# Patient Record
Sex: Female | Born: 1985 | Race: White | Hispanic: Yes | Marital: Married
Health system: Midwestern US, Academic
[De-identification: ages and names within clinical notes are randomized; demographics above are authoritative.]

---

## 2017-07-30 ENCOUNTER — Encounter: Admit: 2017-07-30 | Discharge: 2017-07-30 | Payer: BC Managed Care – PPO

## 2017-07-30 ENCOUNTER — Ambulatory Visit: Admit: 2017-07-30 | Discharge: 2017-07-30 | Payer: BC Managed Care – PPO

## 2017-07-30 ENCOUNTER — Ambulatory Visit: Admit: 2017-07-30 | Discharge: 2017-07-31 | Payer: BC Managed Care – PPO

## 2017-07-30 DIAGNOSIS — F329 Major depressive disorder, single episode, unspecified: Principal | ICD-10-CM

## 2017-07-30 DIAGNOSIS — S6991XS Unspecified injury of right wrist, hand and finger(s), sequela: Principal | ICD-10-CM

## 2017-07-30 DIAGNOSIS — Z0000 Encounter for general adult medical examination without abnormal findings: Principal | ICD-10-CM

## 2017-07-30 LAB — LIPID PROFILE
Lab: 115 mg/dL (ref 6.0–8.0)
Lab: 166 mg/dL (ref <200)
Lab: 17 mg/dL (ref 8.5–10.6)
Lab: 87 mg/dL (ref <150)
Lab: 98 mg/dL (ref <100)

## 2017-07-30 LAB — COMPREHENSIVE METABOLIC PANEL
Lab: 0.5 mg/dL (ref 0.3–1.2)
Lab: 13 U/L (ref 7–40)
Lab: 137 MMOL/L (ref 137–147)
Lab: 26 MMOL/L (ref 21–30)
Lab: 4.5 MMOL/L — ABNORMAL LOW (ref 3.5–5.1)
Lab: 4.6 g/dL (ref 3.5–5.0)
Lab: 49 U/L (ref 25–110)

## 2017-07-30 LAB — HIV 1& 2 AG-AB SCRN W REFLEX HIV 1 PCR QUANT: Lab: NEGATIVE % — ABNORMAL HIGH (ref 11–15)

## 2017-07-30 LAB — HEMOGLOBIN A1C: Lab: 5.4 % (ref 4.0–6.0)
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
## 2017-07-30 NOTE — Progress Notes
Patient presents to clinic for Tdap & Influenza injections. Injections given without difficulty, VIS sheets given. Consent form signed.

## 2017-08-02 ENCOUNTER — Ambulatory Visit: Admit: 2017-07-30 | Discharge: 2017-07-30 | Payer: BC Managed Care – PPO

## 2017-08-02 DIAGNOSIS — S6991XS Unspecified injury of right wrist, hand and finger(s), sequela: Principal | ICD-10-CM

## 2017-08-04 ENCOUNTER — Encounter: Admit: 2017-08-04 | Discharge: 2017-08-04 | Payer: BC Managed Care – PPO

## 2017-08-04 ENCOUNTER — Ambulatory Visit: Admit: 2017-08-04 | Discharge: 2017-08-04 | Payer: BC Managed Care – PPO

## 2017-08-04 DIAGNOSIS — F329 Major depressive disorder, single episode, unspecified: Principal | ICD-10-CM

## 2017-08-04 DIAGNOSIS — S62344S Nondisplaced fracture of base of fourth metacarpal bone, right hand, sequela: Principal | ICD-10-CM
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
## 2017-09-21 ENCOUNTER — Ambulatory Visit: Admit: 2017-09-21 | Discharge: 2017-09-21 | Payer: BC Managed Care – PPO

## 2017-09-21 ENCOUNTER — Encounter: Admit: 2017-09-21 | Discharge: 2017-09-21 | Payer: BC Managed Care – PPO

## 2017-09-21 DIAGNOSIS — M25572 Pain in left ankle and joints of left foot: Principal | ICD-10-CM

## 2017-09-21 DIAGNOSIS — F329 Major depressive disorder, single episode, unspecified: Principal | ICD-10-CM

## 2017-10-27 ENCOUNTER — Encounter: Admit: 2017-10-27 | Discharge: 2017-10-27 | Payer: BC Managed Care – PPO

## 2017-10-27 ENCOUNTER — Ambulatory Visit: Admit: 2017-10-27 | Discharge: 2017-10-27 | Payer: BC Managed Care – PPO

## 2017-10-27 DIAGNOSIS — F329 Major depressive disorder, single episode, unspecified: Principal | ICD-10-CM

## 2017-10-27 DIAGNOSIS — S93402D Sprain of unspecified ligament of left ankle, subsequent encounter: Principal | ICD-10-CM

## 2018-06-06 ENCOUNTER — Encounter: Admit: 2018-06-06 | Discharge: 2018-06-06 | Payer: BC Managed Care – PPO

## 2018-08-24 ENCOUNTER — Encounter: Admit: 2018-08-24 | Discharge: 2018-08-24 | Payer: BC Managed Care – PPO

## 2018-08-24 ENCOUNTER — Ambulatory Visit: Admit: 2018-08-24 | Discharge: 2018-08-24 | Payer: BC Managed Care – PPO

## 2018-08-24 DIAGNOSIS — Z0000 Encounter for general adult medical examination without abnormal findings: Secondary | ICD-10-CM

## 2018-08-24 DIAGNOSIS — Z1159 Encounter for screening for other viral diseases: Secondary | ICD-10-CM

## 2018-08-24 DIAGNOSIS — N979 Female infertility, unspecified: Secondary | ICD-10-CM

## 2018-08-24 DIAGNOSIS — F329 Major depressive disorder, single episode, unspecified: Secondary | ICD-10-CM

## 2018-08-24 LAB — HEPATITIS C ANTIBODY W REFLEX HCV PCR QUANT: Lab: NEGATIVE

## 2018-08-24 LAB — 25-OH VITAMIN D (D2 + D3): Lab: 11 ng/mL — ABNORMAL LOW (ref 30–80)

## 2018-09-08 ENCOUNTER — Encounter: Admit: 2018-09-08 | Discharge: 2018-09-09 | Payer: BC Managed Care – PPO

## 2018-09-08 DIAGNOSIS — R69 Illness, unspecified: Secondary | ICD-10-CM

## 2018-10-10 ENCOUNTER — Encounter: Admit: 2018-10-10 | Discharge: 2018-10-10 | Payer: BC Managed Care – PPO

## 2018-10-26 ENCOUNTER — Encounter: Admit: 2018-10-26 | Discharge: 2018-10-26 | Payer: BC Managed Care – PPO

## 2018-10-31 ENCOUNTER — Encounter: Admit: 2018-10-31 | Discharge: 2018-10-31 | Payer: BC Managed Care – PPO

## 2018-10-31 ENCOUNTER — Ambulatory Visit: Admit: 2018-10-31 | Discharge: 2018-11-01 | Payer: BC Managed Care – PPO

## 2018-10-31 DIAGNOSIS — F329 Major depressive disorder, single episode, unspecified: Principal | ICD-10-CM

## 2018-11-01 ENCOUNTER — Encounter: Admit: 2018-11-01 | Discharge: 2018-11-01 | Payer: BC Managed Care – PPO

## 2018-11-01 ENCOUNTER — Ambulatory Visit: Admit: 2018-11-01 | Discharge: 2018-11-01 | Payer: BC Managed Care – PPO

## 2018-11-01 DIAGNOSIS — R69 Illness, unspecified: Principal | ICD-10-CM

## 2018-11-01 DIAGNOSIS — F329 Major depressive disorder, single episode, unspecified: Principal | ICD-10-CM

## 2018-11-01 DIAGNOSIS — G8929 Other chronic pain: ICD-10-CM

## 2018-11-01 DIAGNOSIS — M25511 Pain in right shoulder: Principal | ICD-10-CM

## 2018-11-01 DIAGNOSIS — S43431D Superior glenoid labrum lesion of right shoulder, subsequent encounter: Principal | ICD-10-CM

## 2018-11-01 NOTE — Progress Notes
Substance and Sexual Activity   Alcohol Use Yes       Family History   Problem Relation Age of Onset   ??? Diabetes Mother    ??? High Cholesterol Mother    ??? Hypertension Mother    ??? Miscarriages Mother    ??? Diabetes Sister    ??? Cancer Maternal Grandmother    ??? High Cholesterol Maternal Grandmother          General Physical Exam:  General/Constitutional:No apparent distress: well-nourished and well developed.  Eyes: Sclera nonicteric, conjunctiva clear  Respiratory:No shortness of breath or dyspnea  Cardiac: No clubbing, cyanosis, or edema  Vascular: No edema, swelling or tenderness, except as noted in detailed exam.  Integumentary:No impressive skin lesions present, except as noted in detailed exam.  Neuro/Psych: Normal mood and affect, oriented to person, place and time.  Musculoskeletal: Normal, except as noted in detailed exam and in HPI.      Review Of Systems:  A 14 point review of systems including HEENT, cardiovascular, pulmonary, gastrointestinal,?genitourinary, psychiatric, neurologic, musculoskeletal, endocrine, and integumentary are negative unless otherwise noted in the history of present illness or on the signed intake form.        Objective:           Vitals:    11/01/18 1427   BP: 112/72   BP Source: Arm, Left Upper   Patient Position: Sitting   Pulse: 75   Weight: 97.5 kg (215 lb)   Height: 175.3 cm (69)   PainSc: Three     Body mass index is 31.75 kg/m???.     ATTESTATION  I personally performed the E/M including history, physical exam, and MDM.    Staff name:  Abran Duke, MD Date:  11/01/2018          Abran Duke, MD    Portions of this noted may have been created using Dragon, a voice recognition software.  Please contact my office for any clarification of documentation    Please send a copy of office notes to the primary care physician and referring providers

## 2019-02-08 ENCOUNTER — Encounter: Admit: 2019-02-08 | Discharge: 2019-02-08

## 2019-02-08 NOTE — Telephone Encounter
Phone call to [facility], pt requests sperm to be sent to our location. Darnelle Spangle will send an email to set up an account c [employer] to have sperm sent to [place]. Email received and forwarded to Royetta Crochet., RN to complete and return.     Please put pt's name and DOB in the subject line. Please use [address] [place], [place]  Routing to  Royetta Crochet., RN for action.

## 2019-02-08 NOTE — Telephone Encounter
Karen Barr with Karen Barr requesting a call back at [phone] in order to ship donor sperm. LVM [id number]

## 2019-02-10 ENCOUNTER — Encounter: Admit: 2019-02-10 | Discharge: 2019-02-10

## 2019-02-10 NOTE — Telephone Encounter
Phone call to pt, pt reports semen going to be directly delivered to pt home, pt just needs a dr name for pw to prove dr is working with pt. This RN will email pw this day.           PW emailed to [email or web address]

## 2019-02-10 NOTE — Telephone Encounter
Pt. returning call. LVM [id number]

## 2019-02-10 NOTE — Telephone Encounter
Phone call to pt, LVM to call this RN back regarding email. Number provided.

## 2019-02-10 NOTE — Telephone Encounter
Routing to Dr. Yedlinsky.

## 2019-02-10 NOTE — Telephone Encounter
Please clarify with the patient that she just needs a doctor's office address to send the sperm to and that she understands we do not do the insemination in our clinic.  Otherwise, ok to have the sperm sent to our office.  Dr. Sanda Klein

## 2019-02-22 ENCOUNTER — Encounter: Admit: 2019-02-22 | Discharge: 2019-02-22

## 2019-03-01 ENCOUNTER — Ambulatory Visit: Admit: 2019-03-01 | Discharge: 2019-03-01

## 2019-03-01 ENCOUNTER — Encounter: Admit: 2019-03-01 | Discharge: 2019-03-01

## 2019-03-01 DIAGNOSIS — H663X9 Other chronic suppurative otitis media, unspecified ear: Secondary | ICD-10-CM

## 2019-03-01 DIAGNOSIS — F329 Major depressive disorder, single episode, unspecified: Secondary | ICD-10-CM

## 2019-03-01 MED ORDER — AMOXICILLIN-POT CLAVULANATE 875-125 MG PO TAB
1 | ORAL_TABLET | Freq: Two times a day (BID) | ORAL | 0 refills | 7.00000 days | Status: DC
Start: 2019-03-01 — End: 2019-06-06

## 2019-03-01 NOTE — Patient Instructions
Please take antibiotics as directed. Follow with ENT for chronic perforation and drainage.

## 2019-03-01 NOTE — Progress Notes
Date of Service: 03/01/2019    Karen Barr is a 33 y.o. female.  DOB: September 27, 1985  MRN: [id number]     Subjective:             History of Present Illness    Here today with complaint of ear pain and drainage. She reports that she has been having ear pain and drainage for the last several weeks but really has it all the time off and on. She was seen elsewhere and diagnosed with a perforation but reports she had tubes about 10 years ago and the hole from the tubes never healed. She has no other issues or concerns.      Review of Systems   All other systems reviewed and are negative.        Objective:         ??? amoxicillin-potassium clavulanate (AUGMENTIN) 875/125 mg tablet Take one tablet by mouth every 12 hours. Take with food.   ??? IBUPROFEN PO Take  by mouth.     Vitals:    03/01/19 1321   BP: 126/66   BP Source: Arm, Left Upper   Patient Position: Sitting   Pulse: 84   Resp: 16   Temp: 36.6 ???C (97.8 ???F)   TempSrc: Oral   SpO2: 98%   Weight: 99.8 kg (220 lb)   Height: 175.3 cm (69)   PainSc: Three     Body mass index is 32.49 kg/m???.     Physical Exam  Vitals signs reviewed.   Constitutional:       General: She is not in acute distress.     Appearance: She is well-developed.   HENT:      Head: Normocephalic.      Right Ear: External ear normal. Tympanic membrane is scarred and perforated.      Left Ear: External ear normal. Tympanic membrane is scarred and perforated.      Nose: Nose normal.      Mouth/Throat:      Pharynx: No oropharyngeal exudate.   Neck:      Musculoskeletal: Normal range of motion.   Cardiovascular:      Rate and Rhythm: Normal rate and regular rhythm.      Heart sounds: Normal heart sounds.   Pulmonary:      Effort: Pulmonary effort is normal.      Breath sounds: Normal breath sounds.   Skin:     General: Skin is warm.      Capillary Refill: Capillary refill takes less than 2 seconds.   Neurological:      Mental Status: She is alert.              Assessment and Plan: 1. Other chronic suppurative otitis media, unspecified laterality       Patient Instructions   Please take antibiotics as directed. Follow with ENT for chronic perforation and drainage.

## 2019-04-27 ENCOUNTER — Encounter: Admit: 2019-04-27 | Discharge: 2019-04-27

## 2019-05-17 ENCOUNTER — Encounter: Admit: 2019-05-17 | Discharge: 2019-05-17 | Payer: BC Managed Care – PPO

## 2019-06-06 ENCOUNTER — Encounter: Admit: 2019-06-06 | Discharge: 2019-06-06 | Payer: BC Managed Care – PPO

## 2019-06-06 ENCOUNTER — Ambulatory Visit: Admit: 2019-06-06 | Discharge: 2019-06-07 | Payer: BC Managed Care – PPO

## 2019-06-06 DIAGNOSIS — Z319 Encounter for procreative management, unspecified: Secondary | ICD-10-CM

## 2019-06-06 DIAGNOSIS — F329 Major depressive disorder, single episode, unspecified: Secondary | ICD-10-CM

## 2019-06-06 MED ORDER — CLOMIPHENE CITRATE 50 MG PO TAB
50 mg | ORAL_TABLET | Freq: Every day | ORAL | 3 refills | 5.00000 days | Status: DC
Start: 2019-06-06 — End: 2019-09-18

## 2019-06-06 MED ORDER — PRENATAL 19 29 MG IRON- 1 MG PO CHEW
1 | ORAL_TABLET | Freq: Every day | ORAL | 3 refills | 90.00000 days | Status: DC
Start: 2019-06-06 — End: 2019-09-18

## 2019-06-06 NOTE — Patient Instructions
Day 1 is the first day of your menstrual cycle  Start taking clomid 168m (2 tablets) once per day starting Day 3. Take for 5 days  Start checking ovulation kit in the morning around day 10.  IUI is day after ovulation kit is positive ([facility])  Home pregnancy test on day 28 if no period

## 2019-06-06 NOTE — Progress Notes
Obtained patient's verbal consent to treat them and their agreement to [facility] financial policy and NPP via this telehealth visit during the [facility] Emergency  Video telehealth appt completed.   Start time: 11:18      Subjective:       History of Present Illness  Karen Barr is a 33 y.o. female.     Hx of:   Medical History:   Diagnosis Date   ? Depression        Patient is on Zoom video-conferencing telemedicine appt today for:  Chief Complaint   Patient presents with   ? Follow Up     fertility       Fertility f/u   Recommended to pt that she try fertility medications (clomid) w/ insemination. Pt states that she has been tracking her menstrual cycles for 18 months. 25-27 days between periods. Denies using LH ovulation test kits at this time. Denies family hx of fertility issues.     Review of Systems   Constitutional: Negative for chills, fatigue and fever.   HENT: Negative.    Eyes: Negative.    Respiratory: Negative for cough and shortness of breath.    Cardiovascular: Negative for chest pain.   Gastrointestinal: Negative for abdominal pain, blood in stool, constipation, diarrhea, nausea and vomiting.   Endocrine: Negative.    Genitourinary: Negative for difficulty urinating, dysuria, frequency, hematuria and urgency.   Musculoskeletal: Negative.    Skin: Negative.    Allergic/Immunologic: Negative.    Neurological: Negative for dizziness, light-headedness and headaches.   Psychiatric/Behavioral: Negative for dysphoric mood and sleep disturbance. The patient is not nervous/anxious.          Objective:         ? clomiPHENE (CLOMID) 50 mg tablet Take one tablet by mouth daily.   ? IBUPROFEN PO Take  by mouth.   ? prenatal no115-iron-folic acid (PRENATAL 19) 29 mg iron- 1 mg chew Chew 1 tablet by mouth daily.       Patient reported vitals signs if applicable:  Vitals:    06/06/19 1112   Weight: 95.3 kg (210 lb)   Height: 175.3 cm (69) Physical exam limited, due to this being a video telemedicine appt.   Physical Exam  Vitals signs and nursing note reviewed.   Constitutional:       General: She is not in acute distress.     Appearance: She is not diaphoretic.   HENT:      Head: Normocephalic and atraumatic.   Neck:      Musculoskeletal: Neck supple.   Pulmonary:      Effort: Pulmonary effort is normal. No respiratory distress.   Neurological:      Mental Status: She is alert.   Psychiatric:         Mood and Affect: Mood normal.         Behavior: Behavior normal.         Thought Content: Thought content normal.         Judgment: Judgment normal.          Assessment and Plan:    Problem List Items Addressed This Visit     None      Visit Diagnoses     Infertility management    -  Primary    Relevant Medications    clomiPHENE (CLOMID) 50 mg tablet    prenatal no115-iron-folic acid (PRENATAL 19) 29 mg iron- 1 mg chew  Visit Start Time 11:18   Visit End Time 11:32.    Total time 14 minutes.    Estimated counseling time 14 minutes.    Counseled pt regarding the symptoms, possible etiologies, treatment options and management decisions.      Patient Instructions   Day 1 is the first day of your menstrual cycle  Start taking clomid 100mg  (2 tablets) once per day starting Day 3. Take for 5 days  Start checking ovulation kit in the morning around day 10.  IUI is day after ovulation kit is positive (LH surge)  Home pregnancy test on day 28 if no period        No future appointments.  In the presence of [name], MD,  I have taken down these notes, Karen Barr, Scribe. 06/06/2019 11:43 AM    [name], MD  June 06, 2019     Voice recognition software was used for this dictation.  Every attempt was made to edit but small errrors may persist.  Please call me directly if any concerns arise.    ATTESTATION    I personally performed the history and physical evaluation and assessment and plan. I have reviewed the transcription and agree with the documentation and have made changes where appropriate.    [name], MD     Clomiphene days 3-7 with home IUI

## 2019-06-07 ENCOUNTER — Encounter: Admit: 2019-06-07 | Discharge: 2019-06-07 | Payer: BC Managed Care – PPO

## 2019-06-07 NOTE — Telephone Encounter
Phone call to pt, states for a specimen to be sent to her house from [facility], they need Dr. Delight Ovens approval. Pt states the fax was sent yesterday. Advised this RN will check for the fax and will call back.

## 2019-06-07 NOTE — Telephone Encounter
TC to patient to schedule with Dr. Sanda Klein. While on the phone the patient stated "Do you know if you guys received our fax yet? It's in regards to at home fertility kit we have."    RT Beth, LPN for review.  Scherry Ran, Therapist, sports for [employer].

## 2019-06-14 ENCOUNTER — Encounter: Admit: 2019-06-14 | Discharge: 2019-06-14 | Payer: BC Managed Care – PPO

## 2019-07-18 ENCOUNTER — Encounter: Admit: 2019-07-18 | Discharge: 2019-07-18 | Payer: BC Managed Care – PPO

## 2019-07-18 ENCOUNTER — Ambulatory Visit: Admit: 2019-07-18 | Discharge: 2019-07-19 | Payer: BC Managed Care – PPO

## 2019-07-18 DIAGNOSIS — N979 Female infertility, unspecified: Secondary | ICD-10-CM

## 2019-07-18 DIAGNOSIS — U071 COVID-19: Secondary | ICD-10-CM

## 2019-07-18 DIAGNOSIS — F329 Major depressive disorder, single episode, unspecified: Secondary | ICD-10-CM

## 2019-07-18 NOTE — Progress Notes
Telehealth Visit Note    Date of Service: 07/18/2019    Subjective:      Obtained patient's verbal consent to treat them and their agreement to [facility] financial policy and NPP via this telehealth visit during the [facility] Emergency       Karen Barr is a 33 y.o. female.    Fever   This is a new problem. The current episode started in the past 7 days. The problem occurs 2 to 4 times per day. The problem has been waxing and waning. The maximum temperature noted was 101 to 101.9 F. The temperature was taken using an oral thermometer. Associated symptoms include congestion, coughing, headaches, muscle aches, nausea, sleepiness and a sore throat.     She and her wife did 1 cycle of ovulation induction with clomid and home IUI which was unsuccessful, Patient is ovulating now. She is holding off this month due to presumed COVID. Got sick 07/16/2019, test result pending       Review of Systems   Constitutional: Positive for fever.   HENT: Positive for congestion and sore throat.    Respiratory: Positive for cough.    Gastrointestinal: Positive for nausea.   Neurological: Positive for headaches.         Objective:         ? acetaminophen (TYLENOL PO) Take  by mouth.   ? clomiPHENE (CLOMID) 50 mg tablet Take one tablet by mouth daily.   ? guaifenesin/dextromethorphan (MUCINEX DM PO) Take  by mouth.   ? IBUPROFEN PO Take  by mouth.   ? prenatal no115-iron-folic acid (PRENATAL 19) 29 mg iron- 1 mg chew Chew 1 tablet by mouth daily.     There were no vitals filed for this visit.  There is no height or weight on file to calculate BMI.     Telehealth Patient Reported Vitals     Row Name 07/18/19 1318                Pulse:  88        Temp:  (!) 38.6 ?C (101.4 ?F)        Temp Source:  Oral        Weight:  95.3 kg (210 lb)        Height:  175.3 cm (69)              Physical Exam  Constitutional: appears well-developed and well-nourished in no acute distress  Head: Normocephalic and atraumatic. Eyes: Conjunctivae and EOM are normal.  Pulmonary/Chest: Effort normal. No respiratory distress.  +cough  Neurological:  alert and oriented to person, place, and time.   Skin: normal color on exposed area.   Psychiatric:  Normal mood and affect. Behavior is normal. Judgment and thought content normal.   Nursing note reviewed.         Assessment and Plan:  Karen Barr was seen today for infertility and covid-19 exposure.    Diagnoses and all orders for this visit:    COVID-19    Infertility, female      Discussed management of COVID symptoms. Patient educated re: expected course and duration.  Follow up after next cycle  There are no Patient Instructions on file for this visit.                           12 minutes spent on this patient's encounter with counseling and coordination of care taking >  50% of the visit.

## 2019-07-19 ENCOUNTER — Encounter: Admit: 2019-07-19 | Discharge: 2019-07-19 | Payer: BC Managed Care – PPO

## 2019-09-18 ENCOUNTER — Encounter: Admit: 2019-09-18 | Discharge: 2019-09-18 | Payer: BC Managed Care – PPO

## 2019-09-18 ENCOUNTER — Ambulatory Visit: Admit: 2019-09-18 | Discharge: 2019-09-18 | Payer: BC Managed Care – PPO

## 2019-09-18 DIAGNOSIS — F329 Major depressive disorder, single episode, unspecified: Secondary | ICD-10-CM

## 2019-09-18 DIAGNOSIS — K2970 Gastritis, unspecified, without bleeding: Secondary | ICD-10-CM

## 2019-09-18 MED ORDER — OMEPRAZOLE 20 MG PO CPDR
20 mg | ORAL_CAPSULE | Freq: Every day | ORAL | 0 refills | Status: DC
Start: 2019-09-18 — End: 2020-02-09

## 2019-09-18 NOTE — Progress Notes
Date of Service: 09/18/2019    Subjective:             Karen Barr is a 34 y.o. female.    History of Present Illness    Abdominal Pain  Started 9 years ago  Right below sternum, would build in intensity  Felt like stabbing,   Went to ER before for severe pain  Went away for a few years, but coming back again, happens ~2-3x per month  Will last for 3-1minutes, max 15 minutes  Is debilitating pain, on floor crying sometimes  Radiates to her back as well  One time BRBPR, 2 weeks ago  No nausea/vomiting  No association with food, no dysphagia  Feels like she has to gasp for breath with episodes  Does take ibuprofen regularly for back spasms          Review of Systems   Constitutional: Negative for chills and fever.   HENT: Negative for congestion and rhinorrhea.    Respiratory: Negative for cough, choking, chest tightness, shortness of breath and wheezing.    Cardiovascular: Negative for chest pain.   Gastrointestinal: Positive for abdominal pain. Negative for blood in stool, constipation, diarrhea, nausea and vomiting.   Genitourinary: Negative for dysuria.   Musculoskeletal: Negative for joint swelling and myalgias.   Skin: Negative for rash.   Neurological: Negative for dizziness and headaches.       Objective:         ? acetaminophen (TYLENOL PO) Take  by mouth.   ? guaifenesin/dextromethorphan (MUCINEX DM PO) Take  by mouth.   ? IBUPROFEN PO Take  by mouth.   ? omeprazole DR (PRILOSEC) 20 mg capsule Take one capsule by mouth daily before breakfast.     Vitals:    09/18/19 1326   BP: 112/76   Pulse: 82   Temp: 36.7 ?C (98.1 ?F)   TempSrc: Tympanic   Weight: 96.2 kg (212 lb)   Height: 175.3 cm (69)     Body mass index is 31.31 kg/m?Marland Kitchen     Physical Exam  Vitals signs reviewed.   Constitutional:       Appearance: Normal appearance.   HENT:      Head: Normocephalic and atraumatic.      Nose: Nose normal.      Mouth/Throat:      Mouth: Mucous membranes are moist.   Eyes: Extraocular Movements: Extraocular movements intact.      Conjunctiva/sclera: Conjunctivae normal.      Pupils: Pupils are equal, round, and reactive to light.   Cardiovascular:      Rate and Rhythm: Normal rate and regular rhythm.   Pulmonary:      Effort: Pulmonary effort is normal.      Breath sounds: Normal breath sounds.   Abdominal:      General: Abdomen is flat. There is no distension.      Palpations: Abdomen is soft.      Tenderness: There is abdominal tenderness in the right lower quadrant, epigastric area and left upper quadrant. There is no guarding. Negative signs include Murphy's sign.      Hernia: There is no hernia in the umbilical area or ventral area.   Musculoskeletal:         General: No swelling.      Right lower leg: No edema.      Left lower leg: No edema.   Skin:     General: Skin is warm.   Neurological:  Mental Status: She is alert and oriented to person, place, and time.   Psychiatric:         Mood and Affect: Mood normal.         Behavior: Behavior normal.          Assessment and Plan:    1. Gastritis without bleeding, unspecified chronicity, unspecified gastritis type       -Trial of PPI for likely gastritis though atypical presentation  -If this improves pain will switch to H2 blocker for long term use  -If no improvement in symptoms will consider obtaining blood work/US     Flu and HPV #1 today

## 2019-09-18 NOTE — Progress Notes
Pt received influenza vaccine in Right deltoid, 1st HPV in Left deltoid.  Pt tolerated injections well, no S/S of adverse reactions. VIS given to pt.

## 2019-10-18 ENCOUNTER — Encounter: Admit: 2019-10-18 | Discharge: 2019-10-18 | Payer: BC Managed Care – PPO

## 2019-10-19 ENCOUNTER — Encounter: Admit: 2019-10-19 | Discharge: 2019-10-19 | Payer: BC Managed Care – PPO

## 2019-10-23 IMAGING — CR CHEST
3 series · 3 of 3 positions shown · non-contrast
Comparison: none

[shoulder external]
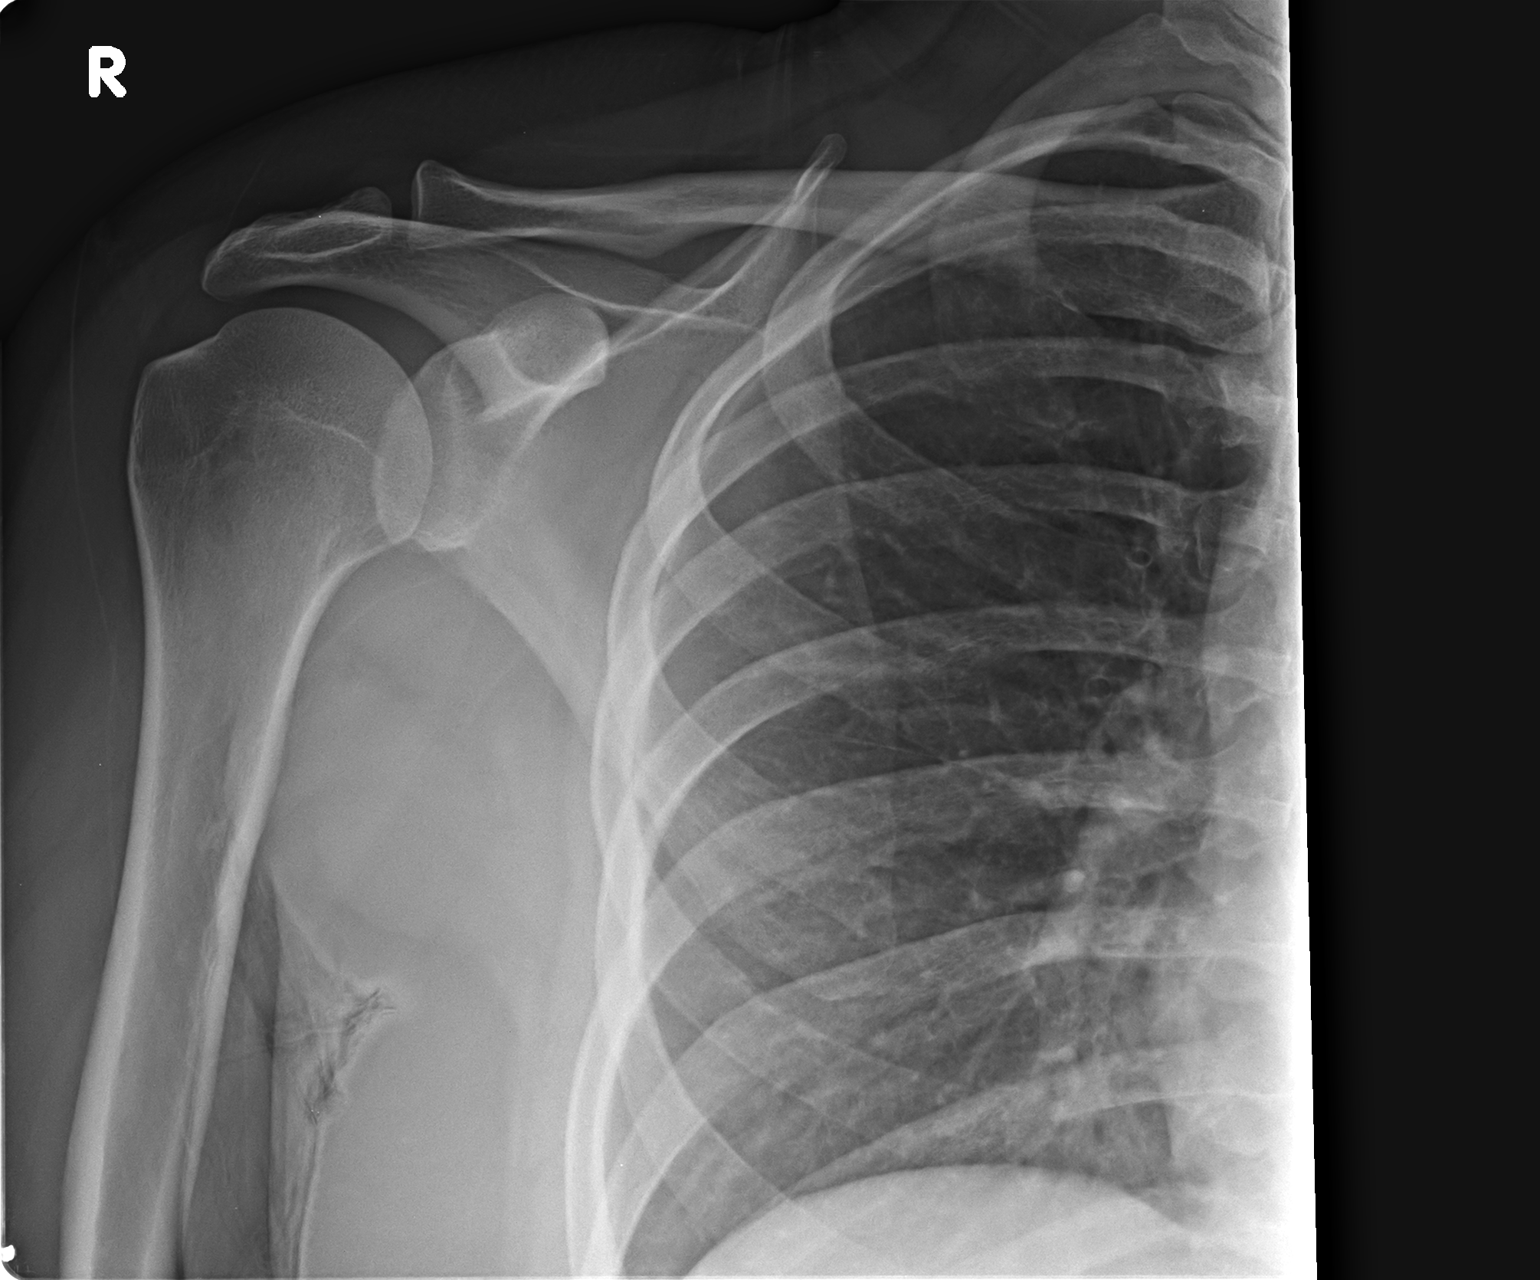

[shoulder internal]
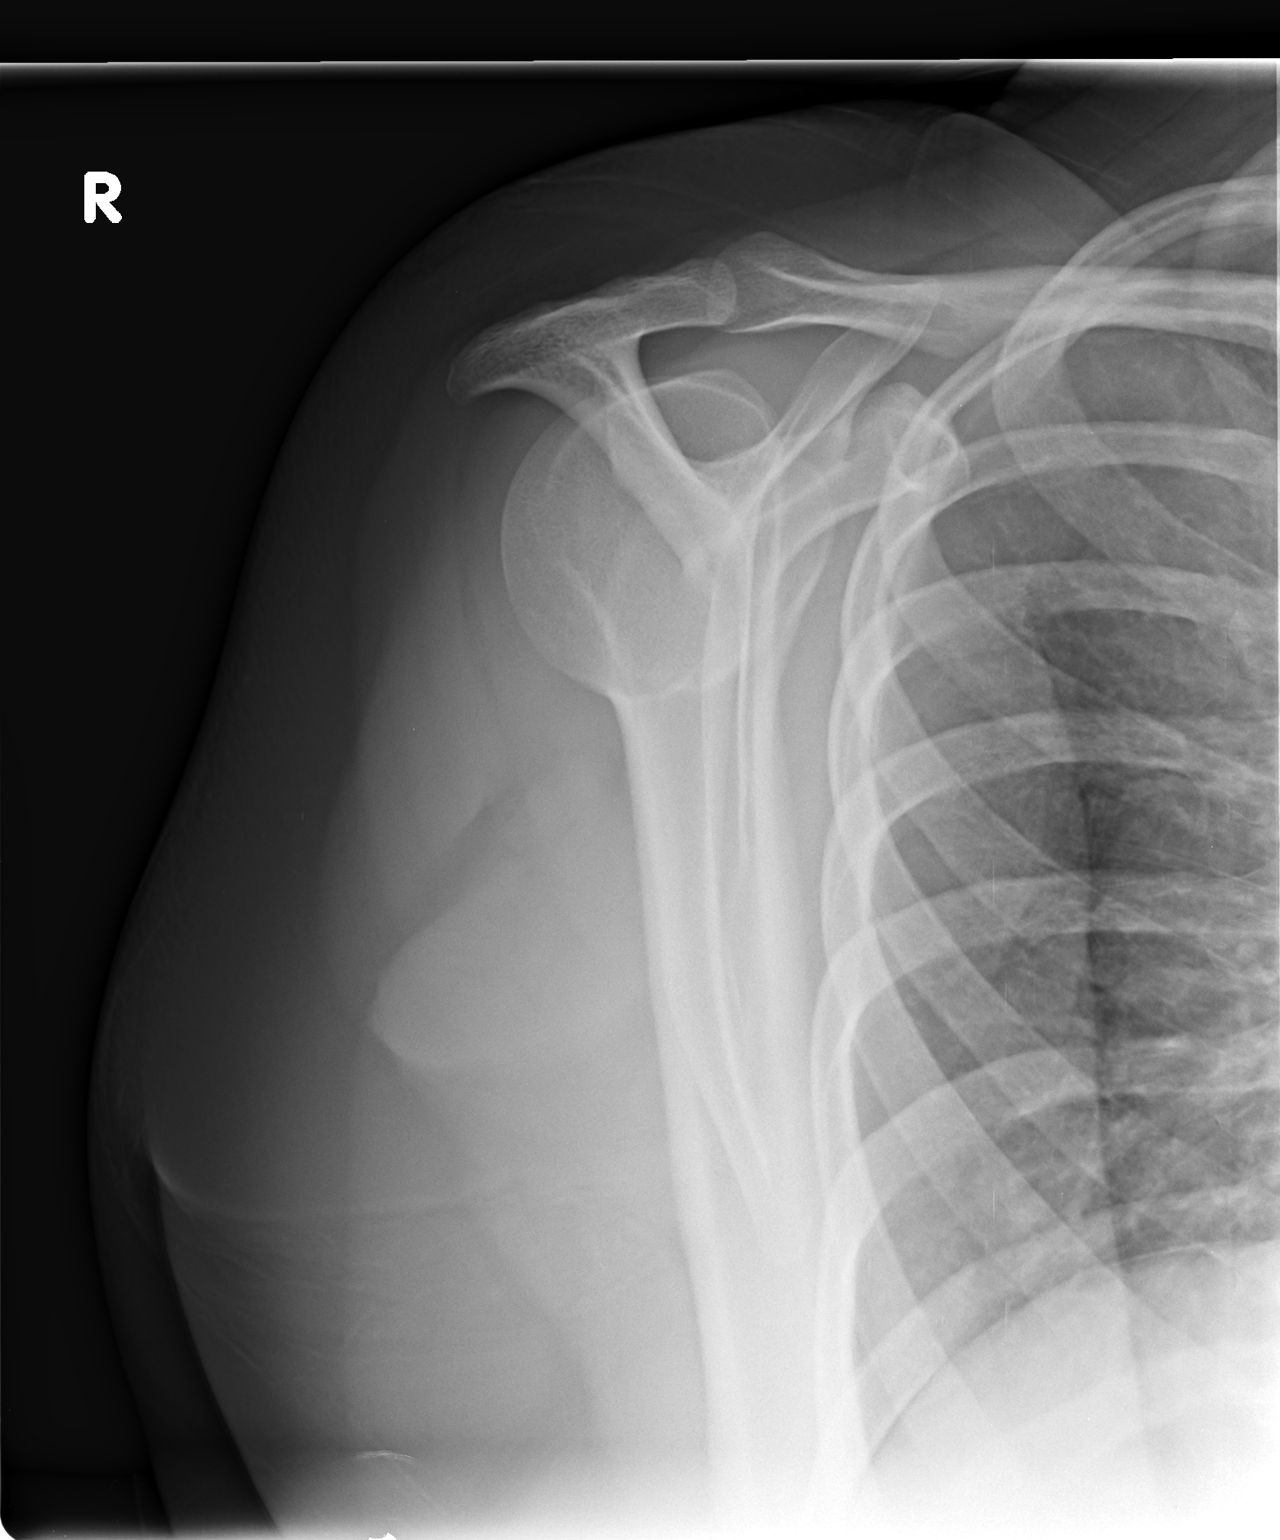

[shoulder y-view]
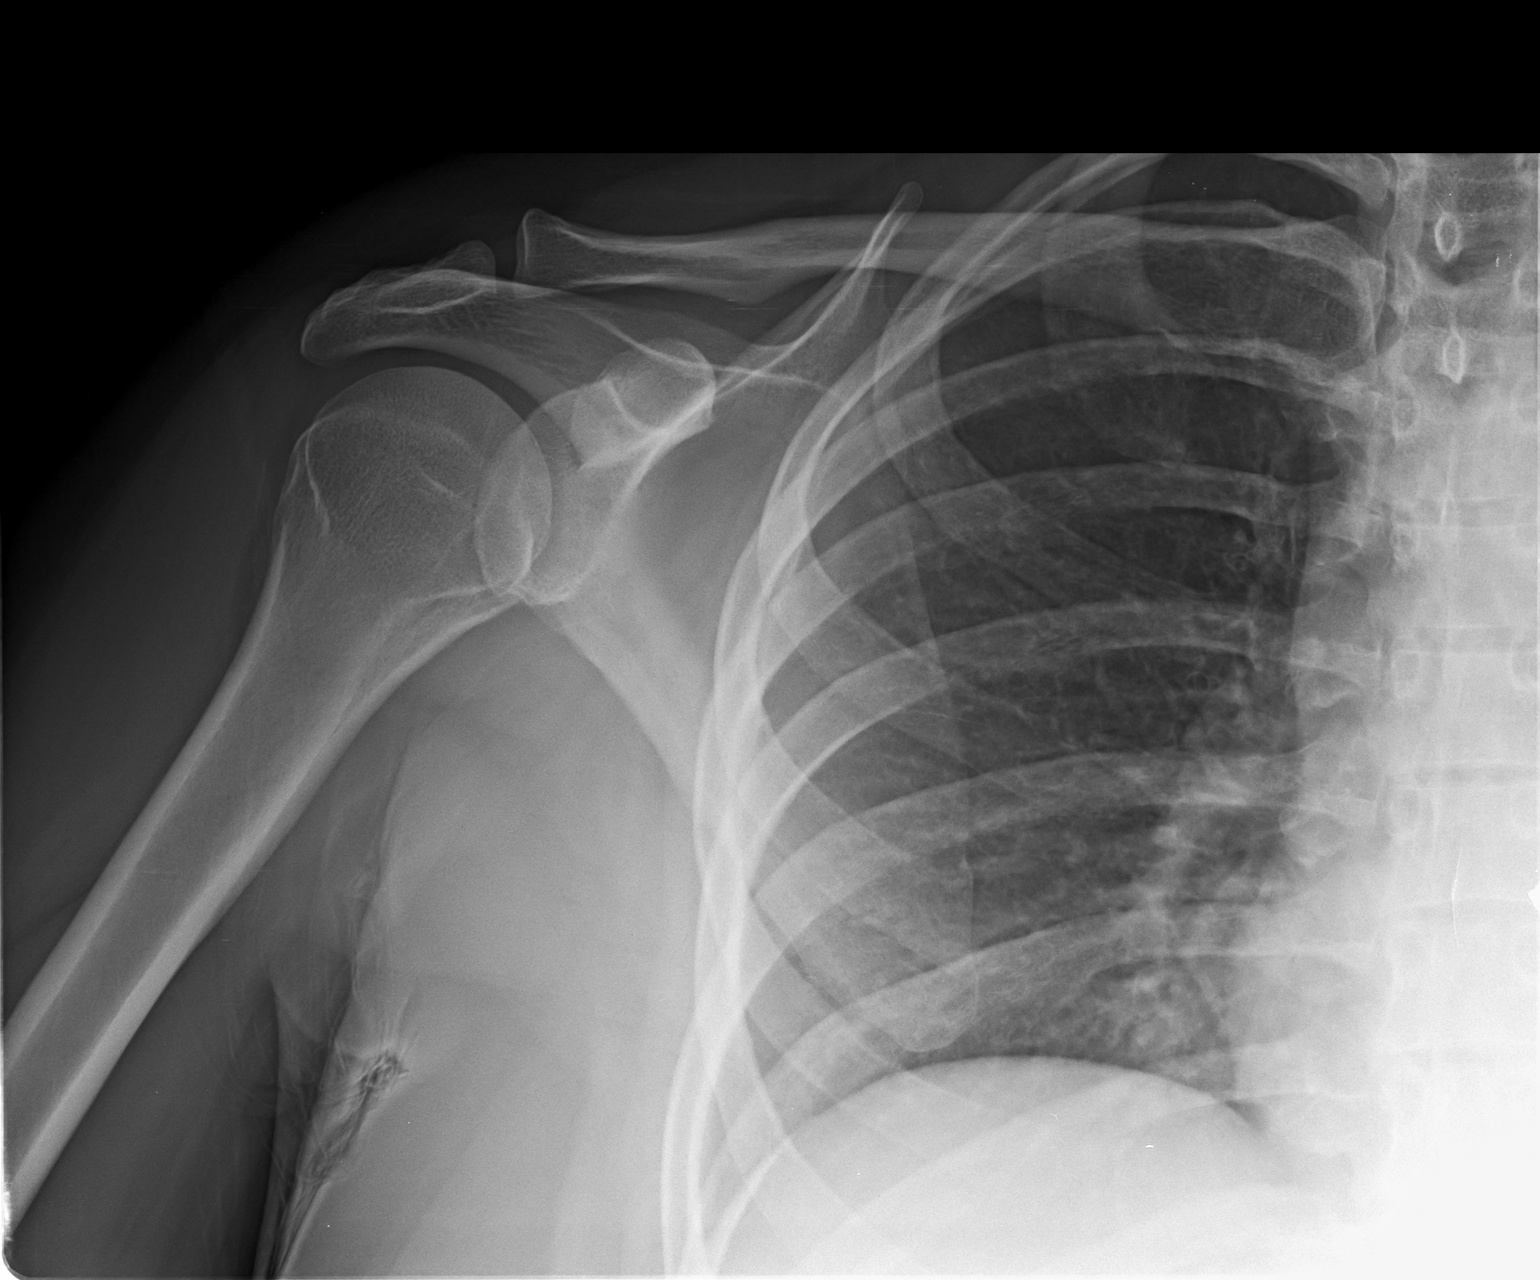

[3 of 3 positions shown; findings below may reference images not displayed]

EXAM

Right shoulder.

INDICATION

Pain after arm being pulled.

FINDINGS

The bone density is maintained. There is no destruction.

There is no fracture or dislocation.

The AC joint is intact.

IMPRESSION

No acute bony abnormality is appreciated.

Tech Notes:

ARM PULLED BY SUSPECT SHE WAS ATTEMPTING TO ARREST. C/O PAIN, DECREASED ROM. DENIES PREGNANCY.
SHIELDED. HB

## 2019-11-16 ENCOUNTER — Encounter: Admit: 2019-11-16 | Discharge: 2019-11-16 | Payer: BC Managed Care – PPO

## 2019-11-16 ENCOUNTER — Ambulatory Visit: Admit: 2019-11-16 | Discharge: 2019-11-16 | Payer: BC Managed Care – PPO

## 2019-11-16 ENCOUNTER — Ambulatory Visit: Admit: 2019-11-16 | Discharge: 2019-11-17 | Payer: BC Managed Care – PPO

## 2019-11-16 DIAGNOSIS — M545 Low back pain: Secondary | ICD-10-CM

## 2019-11-16 DIAGNOSIS — F329 Major depressive disorder, single episode, unspecified: Secondary | ICD-10-CM

## 2019-11-16 NOTE — Progress Notes
Phone call to patient to discuss results.  No acute findings noted on L-spine.  No compression fracture or listhesis identified.  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  SI joints are within normal limits.  Continue conservative management with NSAIDs, topical diclofenac gel and home stretching exercises.  Recommended close follow-up with PCP in 1 to 2 weeks.  Patient verbalized understanding and agreeable with plan of care.    Teodoro Kil, MD  PGY3, Resident Physician   Family Medicine Residency  [facility]

## 2019-11-16 NOTE — Progress Notes
Date of Service: 11/16/2019    Subjective:             Karen Barr is a 34 y.o. female. She is presenting for lower back pain.     History of Present Illness    Karen Barr is a pleasant 34 year old female presenting for complaints of acute on chronic lower back pain. She reports onset of pain on 3/29. No inciting event, trauma or injury to her lower back. She states that she was evaluated at an urgent care later that day and was prescribed a muscle relaxer with a short steroid burst. She continued to have pain in her lower back that radiated down her anterior thigh and calves. She saw her chiropractor on 3/31 and underwent a back adjustment. She reports getting an x-ray at her chiropractor which revealed misalignment of her lower back with her pelvis. She denied any concerning symptoms such as lower extremity weakness, loss of bowel or bladder control or saddle anesthesia. She denied any urinary symptoms or flank pain. Prior to coming to clinic, she was evaluated in outside urgent care and received a Toradol shot and Percocet. No imaging was obtained at this facility either. Appears in significant distress due to her lower back pain. Vital signs stable.     Review of Systems   Constitutional: Positive for activity change. Negative for fatigue and fever.   Gastrointestinal: Negative for abdominal pain.   Genitourinary: Negative for difficulty urinating and pelvic pain.   Musculoskeletal: Positive for arthralgias, back pain and myalgias.     Objective:         ? acetaminophen (TYLENOL PO) Take  by mouth.   ? cyclobenzaprine (FLEXERIL) 5 mg tablet TAKE 1 TO 2 TABLETS BY MOUTH THREE TIMES DAILY AS NEEDED   ? guaifenesin/dextromethorphan (MUCINEX DM PO) Take  by mouth.   ? IBUPROFEN PO Take  by mouth.   ? omeprazole DR (PRILOSEC) 20 mg capsule Take one capsule by mouth daily before breakfast.   ? predniSONE (DELTASONE) 20 mg tablet TAKE 2 TABLETS BY MOUTH DAILY FOR 4 DAYS AND 1 TABLET DAILY X 4 DAYS Vitals:    11/16/19 1439   BP: 124/72   Pulse: 61   Resp: 17   Temp: 36.1 ?C (96.9 ?F)   TempSrc: Tympanic   Weight: 98.9 kg (218 lb)   Height: 175.3 cm (69)   PainSc: Eight     Body mass index is 32.19 kg/m?Marland Kitchen     Physical Exam  Vitals signs reviewed.   Constitutional:       General: She is in acute distress.      Appearance: Normal appearance. She is obese.   HENT:      Head: Normocephalic and atraumatic.      Right Ear: External ear normal.      Left Ear: External ear normal.   Eyes:      Extraocular Movements: Extraocular movements intact.   Neck:      Musculoskeletal: Normal range of motion.   Cardiovascular:      Rate and Rhythm: Normal rate.   Pulmonary:      Effort: Pulmonary effort is normal. No respiratory distress.   Abdominal:      General: Abdomen is flat. There is no distension.   Musculoskeletal:         General: Tenderness present. No deformity or signs of injury.      Comments: Limited range of motion in L-spine. Positive point tenderness in L4-L5 region. Straight leg  raise negative.   Neurological:      General: No focal deficit present.      Mental Status: She is alert. Mental status is at baseline.      Motor: No weakness.   Psychiatric:         Mood and Affect: Mood normal.       Assessment and Plan:    Karen Barr was seen today for back pain.    Diagnoses and all orders for this visit:    Lumbar pain  -     L SPINE COMPLETE; Future; Expected date: 11/16/2019  -We will obtain imaging of L-spine to rule out fracture or dislocation. Continue pain management with Tylenol and ibuprofen as needed. Educated to avoid NSAIDs today as she received a Toradol shot. Utilize heating pad and Voltaren gel. Further management per L-spine x-ray results.      Plan of care discussed with Dr. Ike Bene.     Teodoro Kil, MD  PGY3, Family Medicine  [facility]

## 2020-01-23 ENCOUNTER — Encounter: Admit: 2020-01-23 | Discharge: 2020-01-23 | Payer: BC Managed Care – PPO

## 2020-01-23 ENCOUNTER — Ambulatory Visit: Admit: 2020-01-23 | Discharge: 2020-01-24 | Payer: BC Managed Care – PPO

## 2020-01-23 DIAGNOSIS — F329 Major depressive disorder, single episode, unspecified: Secondary | ICD-10-CM

## 2020-01-23 NOTE — Progress Notes
Subjective:       History of Present Illness  Karen Barr is a 34 y.o. female.  Left knee is painful since Sunday night (2 days ago) when she hurt.  she was playing softball in the outfield.  She ran after a ball and had a sharp intense pain.  Did not twist it or fall- just started hurting.  Finished playing the game in a different position. Yesterday rested it, iced, compression (ACE wrap) and ibuprofen.  Took 800 mg twice since Sunday.   Tweaked the knee a long time ago but nothing like this.  When walking down the stairs if the knee is bent it collapses as she places all her weight on that knee.  Not effecting her sleep.       Wt Readings from Last 4 Encounters:   01/23/20 101.6 kg (224 lb)   11/16/19 98.9 kg (218 lb)   09/18/19 96.2 kg (212 lb)   06/06/19 95.3 kg (210 lb)       BP Readings from Last 2 Encounters:   01/23/20 117/77   11/16/19 124/72        Review of Systems   Musculoskeletal: Positive for arthralgias (left knee).         Objective:         ? acetaminophen (TYLENOL PO) Take  by mouth.   ? cyclobenzaprine (FLEXERIL) 5 mg tablet TAKE 1 TO 2 TABLETS BY MOUTH THREE TIMES DAILY AS NEEDED   ? guaifenesin/dextromethorphan (MUCINEX DM PO) Take  by mouth.   ? IBUPROFEN PO Take  by mouth.   ? omeprazole DR (PRILOSEC) 20 mg capsule Take one capsule by mouth daily before breakfast.     Vitals:    01/23/20 1102   BP: 117/77   Pulse: 80   Resp: 20   Temp: 36.9 ?C (98.4 ?F)   TempSrc: Temporal   SpO2: 100%   Weight: 101.6 kg (224 lb)   Height: 175.3 cm (69)   PainSc: Four     Body mass index is 33.08 kg/m?Marland Kitchen     Physical Exam  Vitals and nursing note reviewed.   Constitutional:       Appearance: Normal appearance.   Musculoskeletal:         General: Swelling and tenderness present.      Comments: Left knee is warm and sl swollen.  There is no medial or lateral laxity.  There may be some laxity on the ACL drawer.  There is a lot of pain with flexion and external rotation to the lower leg on the left.   Skin:     General: Skin is warm and dry.   Neurological:      Mental Status: She is alert.   Psychiatric:         Mood and Affect: Mood normal.         Behavior: Behavior normal.         Thought Content: Thought content normal.         Judgment: Judgment normal.              Assessment and Plan:     [name] was seen today for knee pain.    Diagnoses and all orders for this visit:    Acute pain of left knee  -     MRI LOWER EXT JNT WO/W CONT RIGHT; Future; Expected date: 01/23/2020    Other orders  -     HPV VACCINE 9 VALENT IM (  GARDASIL 9)    Advised her to take the ibuprofen regularly (TID), to ice and wear the ACE wrap.  Will call with any results of the MRI to determine next step.

## 2020-01-24 DIAGNOSIS — M25562 Pain in left knee: Principal | ICD-10-CM

## 2020-01-30 ENCOUNTER — Encounter: Admit: 2020-01-30 | Discharge: 2020-01-30 | Payer: BC Managed Care – PPO

## 2020-01-30 ENCOUNTER — Ambulatory Visit: Admit: 2020-01-30 | Discharge: 2020-01-30 | Payer: BC Managed Care – PPO

## 2020-01-30 DIAGNOSIS — M25562 Pain in left knee: Secondary | ICD-10-CM

## 2020-02-08 ENCOUNTER — Encounter: Admit: 2020-02-08 | Discharge: 2020-02-08 | Payer: BC Managed Care – PPO

## 2020-02-08 DIAGNOSIS — M25562 Pain in left knee: Secondary | ICD-10-CM

## 2020-02-08 NOTE — Telephone Encounter
PC to Pt for pre-appointment planning to determine laterality of pain and/or if any images have been completed prior to first appointment.

## 2020-02-09 ENCOUNTER — Encounter: Admit: 2020-02-09 | Discharge: 2020-02-09 | Payer: BC Managed Care – PPO

## 2020-02-09 ENCOUNTER — Ambulatory Visit: Admit: 2020-02-09 | Discharge: 2020-02-09 | Payer: BC Managed Care – PPO

## 2020-02-09 DIAGNOSIS — S83242D Other tear of medial meniscus, current injury, left knee, subsequent encounter: Secondary | ICD-10-CM

## 2020-02-09 DIAGNOSIS — F329 Major depressive disorder, single episode, unspecified: Secondary | ICD-10-CM

## 2020-02-09 DIAGNOSIS — M25562 Pain in left knee: Secondary | ICD-10-CM

## 2020-02-12 MED ORDER — ROPIVACAINE (PF) 5 MG/ML (0.5 %) IJ SOLN
4 mL | Freq: Once | INTRAMUSCULAR | 0 refills | Status: CP | PRN
Start: 2020-02-12 — End: ?

## 2020-02-12 MED ORDER — TRIAMCINOLONE ACETONIDE 40 MG/ML IJ SUSP
40 mg | Freq: Once | INTRAMUSCULAR | 0 refills | Status: CP | PRN
Start: 2020-02-12 — End: ?

## 2020-02-28 ENCOUNTER — Encounter: Admit: 2020-02-28 | Discharge: 2020-02-28 | Payer: BC Managed Care – PPO

## 2020-02-28 ENCOUNTER — Ambulatory Visit: Admit: 2020-02-28 | Discharge: 2020-02-28 | Payer: BC Managed Care – PPO

## 2020-02-28 DIAGNOSIS — S83242D Other tear of medial meniscus, current injury, left knee, subsequent encounter: Secondary | ICD-10-CM

## 2020-02-28 DIAGNOSIS — M25562 Pain in left knee: Secondary | ICD-10-CM

## 2020-02-29 ENCOUNTER — Encounter: Admit: 2020-02-29 | Discharge: 2020-02-29 | Payer: BC Managed Care – PPO

## 2020-02-29 NOTE — Progress Notes
Family Medical Leave Act paperwork received, completed, and signed.  Copy sent to requested location [facility] Moors at [email or web address]) & medical records for scanning.

## 2020-03-01 ENCOUNTER — Encounter: Admit: 2020-03-01 | Discharge: 2020-03-01 | Payer: BC Managed Care – PPO

## 2020-03-01 NOTE — Telephone Encounter
Patient called to discuss surgery. Surgery date 8.3.21 determined based on patient and provider availability. POV scheduled.

## 2020-03-05 ENCOUNTER — Ambulatory Visit: Admit: 2020-03-05 | Discharge: 2020-03-05 | Payer: BC Managed Care – PPO

## 2020-03-05 ENCOUNTER — Encounter: Admit: 2020-03-05 | Discharge: 2020-03-05 | Payer: BC Managed Care – PPO

## 2020-03-05 DIAGNOSIS — M25562 Pain in left knee: Secondary | ICD-10-CM

## 2020-03-05 DIAGNOSIS — S83242D Other tear of medial meniscus, current injury, left knee, subsequent encounter: Secondary | ICD-10-CM

## 2020-03-05 DIAGNOSIS — Z9229 Personal history of other drug therapy: Secondary | ICD-10-CM

## 2020-03-05 MED ORDER — CEFAZOLIN 1 GRAM IJ SOLR
2 g | Freq: Once | INTRAVENOUS | 0 refills | Status: CN
Start: 2020-03-05 — End: ?

## 2020-03-06 ENCOUNTER — Encounter: Admit: 2020-03-06 | Discharge: 2020-03-06 | Payer: BC Managed Care – PPO

## 2020-03-06 DIAGNOSIS — F329 Major depressive disorder, single episode, unspecified: Secondary | ICD-10-CM

## 2020-03-12 ENCOUNTER — Encounter: Admit: 2020-03-12 | Discharge: 2020-03-12 | Payer: BC Managed Care – PPO

## 2020-03-12 DIAGNOSIS — S83242D Other tear of medial meniscus, current injury, left knee, subsequent encounter: Secondary | ICD-10-CM

## 2020-03-12 DIAGNOSIS — M25562 Pain in left knee: Secondary | ICD-10-CM

## 2020-03-12 NOTE — Telephone Encounter
Patient called to schedule surgery.  Date of surgery determined based on availability of both patient and Talmadge Chad, MD.  The patient was scheduled for Left MMR vs PMM on 03/19/2020 at [facility].  Patient was provided with Talmadge Chad, MD pre-surgery packet along with physical therapy prescription via MyChart.      Patient instructed to schedule POV for 14-15 days later with Talmadge Chad, MD.      Post-operative physical therapy: Patient plans to complete physical therapy at [facility].      She will be called with arrival date and time for day of surgery once scheduling is completed.  Questions answered and reassurance given.  Instructed to call [phone] for further questions or problems.  Verbalized understanding of the instructions as given.      Dr. Criselda Peaches and patient have agreed to schedule procedure as extended recovery after surgery: No    Patient has completed COVID-19 vaccination: Yes  Date of completion: 10/06/2019  Patient has tested positive for COVID19 in last 90 days No  If yes, date and location of test: n/a    Vitals:  BP 118/80  - Pulse 80  - Ht 175.3 cm (69)  - Wt 99.8 kg (220 lb)  - LMP 01/09/2020  - BMI 32.49 kg/m?       Deep Vein Thrombosis (DVT) Risk Assessment  *Use for all lower extremity procedures  1 point for each applicable item:   Total for section: 0  2 points for each applicable item:      Surgery > 45 minutes  Total for section: 2  3 points for each applicable item:     Total for section: 0  5 points for each applicable item:     Total for section: 0  Total Risk Factor Score: 2    DVT Risk Assessment Score  Points Risk     Recommendation     0 Very low VTE risk   Early and frequent ambulation    1-2 Low VTE risk    Mechanical prophylaxis    3-4 Moderate VTE risk and low bleed risk Pharmacologic prophylaxis  >/= 5 High VTE risk and low bleed risk  Mechanical AND pharmacologic prophylaxis    *DVT risk assessment scoring has been adapted from the Caprini Risk Assessment.    This risk stratification score is based on a validated DVT prophylaxis screening  tool, Thrombotic Risk Assessment: A Hybrid Approach. 2005. http://www.venousdisease.com/Publications/.  This is the recommended screening tool per the Quality Improvement Institute and the Accredication association for ambulatory health care, Inc.

## 2020-03-18 ENCOUNTER — Encounter: Admit: 2020-03-18 | Discharge: 2020-03-18 | Payer: BC Managed Care – PPO

## 2020-03-19 ENCOUNTER — Encounter: Admit: 2020-03-19 | Discharge: 2020-03-19 | Payer: BC Managed Care – PPO

## 2020-03-19 DIAGNOSIS — F329 Major depressive disorder, single episode, unspecified: Secondary | ICD-10-CM

## 2020-03-19 MED ORDER — ONDANSETRON HCL (PF) 4 MG/2 ML IJ SOLN
INTRAVENOUS | 0 refills | Status: DC
Start: 2020-03-19 — End: 2020-03-19

## 2020-03-19 MED ORDER — LACTATED RINGERS IV SOLP
INTRAVENOUS | 0 refills | Status: DC
Start: 2020-03-19 — End: 2020-03-24

## 2020-03-19 MED ORDER — LIDOCAINE (PF) 20 MG/ML (2 %) IJ SOLN
INTRAVENOUS | 0 refills | Status: DC
Start: 2020-03-19 — End: 2020-03-19

## 2020-03-19 MED ORDER — OXYCODONE 5 MG PO TAB
5-10 mg | Freq: Once | ORAL | 0 refills | Status: CP | PRN
Start: 2020-03-19 — End: ?

## 2020-03-19 MED ORDER — HALOPERIDOL LACTATE 5 MG/ML IJ SOLN
1 mg | Freq: Once | INTRAVENOUS | 0 refills | Status: AC | PRN
Start: 2020-03-19 — End: ?

## 2020-03-19 MED ORDER — NAPROXEN 500 MG PO TAB
500 mg | ORAL_TABLET | Freq: Two times a day (BID) | ORAL | 1 refills | Status: AC
Start: 2020-03-19 — End: ?
  Filled 2020-03-19: qty 30, 15d supply, fill #1

## 2020-03-19 MED ORDER — ACETAMINOPHEN 500 MG PO TAB
1000 mg | Freq: Once | ORAL | 0 refills | Status: CP
Start: 2020-03-19 — End: ?

## 2020-03-19 MED ORDER — CEFAZOLIN 1 GRAM IJ SOLR
2 g | Freq: Once | INTRAVENOUS | 0 refills | Status: CP
Start: 2020-03-19 — End: ?

## 2020-03-19 MED ORDER — METOCLOPRAMIDE HCL 5 MG/ML IJ SOLN
10 mg | Freq: Once | INTRAVENOUS | 0 refills | Status: AC | PRN
Start: 2020-03-19 — End: ?

## 2020-03-19 MED ORDER — SODIUM CHLORIDE 0.9 % IR SOLN
0 refills | Status: DC
Start: 2020-03-19 — End: 2020-03-24

## 2020-03-19 MED ORDER — ROPIVACAINE (PF) 5 MG/ML (0.5 %) IJ SOLN
0 refills | Status: DC
Start: 2020-03-19 — End: 2020-03-24

## 2020-03-19 MED ORDER — FENTANYL CITRATE (PF) 50 MCG/ML IJ SOLN
25 ug | INTRAVENOUS | 0 refills | Status: DC | PRN
Start: 2020-03-19 — End: 2020-03-24

## 2020-03-19 MED ORDER — PROPOFOL INJ 10 MG/ML IV VIAL
INTRAVENOUS | 0 refills | Status: DC
Start: 2020-03-19 — End: 2020-03-19

## 2020-03-19 MED ORDER — CELECOXIB 200 MG PO CAP
200 mg | Freq: Once | ORAL | 0 refills | Status: CP
Start: 2020-03-19 — End: ?

## 2020-03-19 MED ORDER — FENTANYL CITRATE (PF) 50 MCG/ML IJ SOLN
50 ug | INTRAVENOUS | 0 refills | Status: DC | PRN
Start: 2020-03-19 — End: 2020-03-24

## 2020-03-19 MED ORDER — MIDAZOLAM 1 MG/ML IJ SOLN
INTRAVENOUS | 0 refills | Status: DC
Start: 2020-03-19 — End: 2020-03-19

## 2020-03-19 MED ORDER — PROMETHAZINE 25 MG/ML IJ SOLN
6.25 mg | INTRAVENOUS | 0 refills | Status: DC | PRN
Start: 2020-03-19 — End: 2020-03-24

## 2020-03-19 MED ORDER — LIDOCAINE (PF) 10 MG/ML (1 %) IJ SOLN
.2 mL | INTRAMUSCULAR | 0 refills | Status: DC | PRN
Start: 2020-03-19 — End: 2020-03-24

## 2020-03-19 MED ORDER — FENTANYL CITRATE (PF) 50 MCG/ML IJ SOLN
INTRAVENOUS | 0 refills | Status: DC
Start: 2020-03-19 — End: 2020-03-19

## 2020-03-19 MED ORDER — DEXAMETHASONE SODIUM PHOSPHATE 4 MG/ML IJ SOLN
INTRAVENOUS | 0 refills | Status: DC
Start: 2020-03-19 — End: 2020-03-19

## 2020-03-19 MED ORDER — HYDROMORPHONE (PF) 2 MG/ML IJ SYRG
.5-1 mg | INTRAVENOUS | 0 refills | Status: DC | PRN
Start: 2020-03-19 — End: 2020-03-24

## 2020-03-19 MED ORDER — HYDROCODONE-ACETAMINOPHEN 5-325 MG PO TAB
1-2 | ORAL_TABLET | ORAL | 0 refills | 30.00000 days | Status: AC | PRN
Start: 2020-03-19 — End: ?
  Filled 2020-03-19: qty 20, 3d supply, fill #1

## 2020-03-19 NOTE — Progress Notes
Operative report, PT orders and protocol sent to [facility] [address] [place] (F: [phone])

## 2020-03-20 ENCOUNTER — Encounter: Admit: 2020-03-20 | Discharge: 2020-03-20 | Payer: BC Managed Care – PPO

## 2020-03-20 DIAGNOSIS — F329 Major depressive disorder, single episode, unspecified: Secondary | ICD-10-CM

## 2020-03-29 NOTE — Patient Instructions
Please do not hesitate to contact my office with any questions.     Scott Mullen, MD FAAOS - Orthopedic Surgeon, Sports Medicine  Corey Whitesides, PA-C - Physician Assistant  [facility] - Phone [phone] - Fax [phone]   [address], [address] - [place], [place] [address]    Dr Mullen's clinic phone line: [phone] - this phone is not physically able to be answered every day, but voicemail is checked regularly and responded to as quickly as possible.    Preferred contact for non-urgent clinical questions: MyChart    For follow up appointments, please call [phone].    Essynce Munsch, MS, LAT, ATC - Clinical Athletic Trainer  Jill Hallier, RN, BSN - Clinical Nurse Coordinator    Thank you for supporting our practice! Your feedback helps us deliver the highest quality care.  Review us at: [email or web address]

## 2020-04-01 ENCOUNTER — Ambulatory Visit: Admit: 2020-04-01 | Discharge: 2020-04-01 | Payer: BC Managed Care – PPO

## 2020-04-01 ENCOUNTER — Encounter: Admit: 2020-04-01 | Discharge: 2020-04-01 | Payer: BC Managed Care – PPO

## 2020-04-01 DIAGNOSIS — F329 Major depressive disorder, single episode, unspecified: Secondary | ICD-10-CM

## 2020-04-01 DIAGNOSIS — Z9889 Other specified postprocedural states: Secondary | ICD-10-CM

## 2020-04-01 NOTE — Progress Notes
CC:  Status post Left knee arthroscopy, partial medial menisectomy 03/19/20     HPI:  Karen Barr is a 34 y.o. female who presents today for routine 2 weeks post-operative follow up of her knee surgery.   She is doing well with minimal complaints.  She denies wound problems.      Pain has been well controlled.  She is attending [facility].  No setbacks to discuss.  She is no longer using crutches.  She is away from work as a cop.  She is currently enrolled at [employer] and working towards her undergraduate degree.  No concerns today.    Physical Exam:    Left knee exam:  Incisions clean and dry. No erythema or induration.  Calves soft and non-tender  Normal gait    Discussed intra-op pictures    Impression: 34 y.o. female 2-weeks status post the above surgery doing well.    Plan:  She may progress through the PT protocol as planned (knee arthroscopy).  We discussed postoperative precautions moving forward. Slowly advance activity as tolerated. Work status updated. I will have her see Dr. Criselda Peaches in 4 weeks.  All of her questions were answered today.  She was comfortable with the plan at the end of the visit.

## 2020-04-03 ENCOUNTER — Encounter: Admit: 2020-04-03 | Discharge: 2020-04-03 | Payer: BC Managed Care – PPO

## 2020-04-03 NOTE — Progress Notes
Short Term Disability paperwork received, completed, and signed.  Copy sent to requested location [facility] at Fax #: [phone]) & medical records for scanning.

## 2020-04-04 ENCOUNTER — Encounter: Admit: 2020-04-04 | Discharge: 2020-04-04 | Payer: BC Managed Care – PPO

## 2020-04-29 ENCOUNTER — Encounter: Admit: 2020-04-29 | Discharge: 2020-04-29 | Payer: BC Managed Care – PPO

## 2020-04-30 ENCOUNTER — Encounter: Admit: 2020-04-30 | Discharge: 2020-04-30 | Payer: BC Managed Care – PPO

## 2020-04-30 NOTE — Telephone Encounter
Called patient to follow up on 04/29/2020 office no show.  Received PT note discharging her from therapy, states she is doing well.  Spoke to patient.  She agreed she is doing well, can do all of her normal daily activities.  Hasn't tried jogging yet, will continue home exercise program and easing back into activities.  Patient wishes to follow up on PRN basis, will call if questions or concerns arise. Questions answered and reassurance given.  Instructed to call [phone] for further questions or problems.  Verbalized understanding of the instructions as given.

## 2020-05-17 ENCOUNTER — Encounter: Admit: 2020-05-17 | Discharge: 2020-05-17 | Payer: BC Managed Care – PPO

## 2020-05-24 ENCOUNTER — Encounter: Admit: 2020-05-24 | Discharge: 2020-05-24 | Payer: BC Managed Care – PPO

## 2020-06-17 ENCOUNTER — Encounter: Admit: 2020-06-17 | Discharge: 2020-06-17 | Payer: BC Managed Care – PPO

## 2020-06-17 NOTE — Telephone Encounter
PC to pt, LVM [facility] is booked for this week, with blood in stool for 6 weeks pt will need to report to ER ASAP for further evaluation and testing, blood in BM Is not normal, small smear can happen with hemorrhoids however that should be addressed as well and with pt reporting blood for 6 weeks pt needs to be evaluated in ER ASAP. [facility] number provided.     PC to Pt, LVM second VM as this Rn trying to reach pt regarding blood in stool. [facility] number provided with same message as above for pt to report to ER.     My Chart message to pt as well.

## 2020-06-18 ENCOUNTER — Encounter: Admit: 2020-06-18 | Discharge: 2020-06-18 | Payer: BC Managed Care – PPO

## 2020-06-19 ENCOUNTER — Encounter: Admit: 2020-06-19 | Discharge: 2020-06-19 | Payer: BC Managed Care – PPO

## 2020-06-19 NOTE — Telephone Encounter
lvm for pt to schedule with Dr.Yedlinsky on 06/28/2020 in her sports med clinic

## 2020-06-19 NOTE — Telephone Encounter
Regarding: RE: message  Contact: [phone]  ----- Message from [name], MD sent at 06/19/2020 11:51 AM CDT -----  Please schedule into a sports medicine clinic opening with me on Friday 11/12. Thank you!     ----- Message from [name] to [name], MD sent at 06/18/2020  3:05 PM -----   Rip Harbour, I will immediately let you know if anything else changes. I can come in whenever you have an availability.      ----- Message -----       From:Nicole Kem Kays, MD       Sent:06/18/2020  2:55 PM CDT         UJ:[id number] Karen Barr    Subject:RE: message    Karen Barr,  Thanks for your response. That is reassuring and I don't think you need to go to the ER at this time.  Were you exposed to any unusual water sources or foods? I'm wondering if we need to test for giardia or something like that.  Otherwise I think it would be okay to see if your symptoms continue to improve with better nutrition. If you start developing any other symptoms (unintentional weight loss, fever/chills, dizziness, pain, etc) please let me know ASAP. I can get you in to see me next Friday 11/12 in the morning if that works for you.  Dr. Pilar Plate      ----- Message -----       From:Karen Barr       Sent:06/18/2020  1:46 PM CDT         WG:[id number] Kem Kays, MD    Subject:RE: message    Hello Dr Pilar Plate!  So it started when I traveled to [facility] about 6 or 7 weeks ago. I broke keto on that trip, and I first started noticing blood in my stool around that time. That's normal for me when I switch from keto to normal diet, as it was just a little. Well since then it's never gone away for more than a day. It got thinner and the consistency became less solid, and at times it has a feathered, loose appearance. I figured it was a hemorrhoid and just wanted to monitor to see if anything g changed. I became more concerned after I passed gas while on the toilet and when I was finished I noticed blood had sprayed inside of the bowl. That's when I made the appointment.     Since then I have not had another instance of spraying blood, and the stool has become larger (as they got pretty thin at one point). I still get bits of mucous with a dash of blood, and some blood in the stool, but not nearly as much as before. The consistency is still off, somewhat loose and feathered in appearance. Sometimes it'll be smooth, but I haven't had a normal looking stool since this all started.    I've had no pain, unexplained weight loss/gain, dizziness, or anything except for the symptoms previously mentioned. My diet has been pretty poor these last 6 months or so, so I'm trying a clean eating diet with zero processed foods. It has seemed to help the last week(how long we have been on the diet), but only slightly. Maybe it will have more of an effect after I'm on this diet for a few weeks.    I haven't tried any remedies except changing my diet, as anything that is happening is internal. I do not notice anything external.  I would have immediately went to an ER if I had noticed any other symptoms, but not one other symptom has appeared. I wear a fitbit and I've not noticed any changes with my HR or sleep patterns.    I've tried to reach out to the nurse who called me and told me to go to the ER. I don't feel as if going to the ER is completely necessary, as I feel completely fine and the amount of blood in the stool has lessened. I'll go if you feel I must after reading this.     I look forward to hearing from you. Thanks.  Karen Barr      ----- Message -----       From:Nicole Kem Kays, MD       Sent:06/18/2020  8:05 AM CDT         EA:[id number] Karen Barr    Subject:message    Karen Barr,  Can you tell me more about the blood in your stool? Have you had any other symptoms? Any painful bowel movements? Have you tried treating with anything?  Regards,  Dr. Pilar Plate

## 2020-06-28 ENCOUNTER — Encounter: Admit: 2020-06-28 | Discharge: 2020-06-28 | Payer: BC Managed Care – PPO

## 2020-06-28 ENCOUNTER — Ambulatory Visit: Admit: 2020-06-28 | Discharge: 2020-06-28 | Payer: BC Managed Care – PPO

## 2020-06-28 DIAGNOSIS — F331 Major depressive disorder, recurrent, moderate: Secondary | ICD-10-CM

## 2020-06-28 DIAGNOSIS — F32A Depression: Secondary | ICD-10-CM

## 2020-06-28 DIAGNOSIS — R198 Other specified symptoms and signs involving the digestive system and abdomen: Secondary | ICD-10-CM

## 2020-06-28 LAB — LEUKOCYTES, FECAL

## 2020-06-28 LAB — COMPREHENSIVE METABOLIC PANEL
Lab: 0.3 mg/dL (ref 0.3–1.2)
Lab: 12 U/L (ref 7–40)
Lab: 13 U/L (ref 7–56)
Lab: 140 MMOL/L (ref 137–147)
Lab: 27 MMOL/L (ref 21–30)
Lab: 4.2 g/dL (ref 3.5–5.0)
Lab: 4.7 MMOL/L (ref 3.5–5.1)
Lab: 6.8 g/dL (ref 6.0–8.0)
Lab: 63 U/L (ref 25–110)
Lab: 8 (ref 3–12)
Lab: 9.3 mg/dL (ref 8.5–10.6)
Lab: >60 mL/min (ref >60)
Lab: >60 mL/min (ref >60)

## 2020-06-28 LAB — CELIAC SCREEN

## 2020-06-28 LAB — OVA AND PARASITES, FECAL

## 2020-06-28 LAB — CRYPTOSPORIDIUM, FECAL

## 2020-06-28 LAB — GIARDIA SCREEN,FECAL: Lab: NEGATIVE

## 2020-06-28 LAB — HEPATITIS PANEL, ACUTE

## 2020-06-28 LAB — C DIFFICILE BY PCR

## 2020-06-28 MED ORDER — SERTRALINE 25 MG PO TAB
25 mg | ORAL_TABLET | Freq: Every day | ORAL | 1 refills | Status: AC
Start: 2020-06-28 — End: ?
  Filled 2020-06-28: qty 30, 30d supply, fill #1

## 2020-06-28 NOTE — Patient Instructions
Go to lab - blood and stool sample  Start zoloft 25mg  - half tablet for 4-6 days then increase to 1 tablet

## 2020-06-28 NOTE — Progress Notes
Date of Service: 06/28/2020    Subjective:             Karen Barr is a 34 y.o. female.    Patient Reported Other  What medical problem brings you in to see the provider? Thin, bloody stool.. This is a chronic problem. The current episode started more than 1 month ago. The problem occurs daily. The problem has been unchanged. The pain score is: 0/10. The pain severity is no pain.  Associated symptoms include a change in bowel habit. Pertinent negatives include no abdominal pain, anorexia, arthralgias, chest pain, chills, congestion, coughing, diaphoresis, fatigue, fever, headaches, joint swelling, myalgias, nausea, neck pain, numbness, rash, sore throat, swollen glands, urinary symptoms, vertigo, visual change, vomiting or weakness. Nothing aggravates the symptoms. She has tried nothing for the symptoms. The treatment provided no relief.     MyChart message 06/19/2020:  So it started when I traveled to [facility] about 6 or 7 weeks ago. I broke keto on that trip, and I first started noticing blood in my stool around that time. That's normal for me when I switch from keto to normal diet, as it was just a little. Well since then it's never gone away for more than a day. It got thinner and the consistency became less solid, and at times it has a feathered, loose appearance. I figured it was a hemorrhoid and just wanted to monitor to see if anything g changed. I became more concerned after I passed gas while on the toilet and when I was finished I noticed blood had sprayed inside of the bowl. That's when I made the appointment.     Since then I have not had another instance of spraying blood, and the stool has become larger (as they got pretty thin at one point). I still get bits of mucous with a dash of blood, and some blood in the stool, but not nearly as much as before. The consistency is still off, somewhat loose and feathered in appearance. Sometimes it'll be smooth, but I haven't had a normal looking stool since this all started.    I've had no pain, unexplained weight loss/gain, dizziness, or anything except for the symptoms previously mentioned. My diet has been pretty poor these last 6 months or so, so I'm trying a clean eating diet with zero processed foods. It has seemed to help the last week(how long we have been on the diet), but only slightly. Maybe it will have more of an effect after I'm on this diet for a few weeks.    I haven't tried any remedies except changing my diet, as anything that is happening is internal. I do not notice anything external. I would have immediately went to an ER if I had noticed any other symptoms, but not one other symptom has appeared. I wear a fitbit and I've not noticed any changes with my HR or sleep patterns.    Also reports depression/anxiety symptoms for past few months. She has reached out to counseling services and is set up for appointment. She was on medication while in [employer] many years ago but did not like it, can't remember name. Feeling mostly low energy. 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     Review of Systems   Constitutional: Negative for chills, diaphoresis, fatigue and fever.   HENT: Negative for congestion and sore throat.  Respiratory: Negative for cough.    Cardiovascular: Negative for chest pain.   Gastrointestinal: Positive for change in bowel habit. Negative for abdominal pain, anorexia, nausea and vomiting.   Musculoskeletal: Negative for arthralgias, joint swelling, myalgias and neck pain.   Skin: Negative for rash.   Neurological: Negative for vertigo, weakness, numbness and headaches.         Objective:         ? cyanocobalamin (vitamin B-12) (VITAMIN B-12 PO) Take  by mouth.   ? ergocalciferol (vitamin D2) (VITAMIN D PO) Take  by mouth.   ? ferrous sulfate (IRON PO) Take  by mouth.   ? sertraline (ZOLOFT) 25 mg tablet Take one tablet by mouth daily. Indications: major depressive disorder     Vitals:    06/28/20 0849   BP: 106/76   Pulse: 76   Resp: 16   SpO2: 100%   Weight: 103 kg (227 lb)   Height: 175.3 cm (69)     Body mass index is 33.52 kg/m?Marland Kitchen     Physical Exam  General: No acute distress, well-appearing. AA&Ox3  Eyes: sclera anicteric; conjunctiva clear and well perfused, EOMI, PERRL  HEENT: Atraumatic, normocephalic, MMM  Neck: no JVD or nodes; soft and supple; TML  Lungs: CTA bilat. Normal respiratory effort.  Symmetric expansion upon inspiration.  CV: S1, S2 normal with regular rate, without rub, gallop or murmur.  Abd: soft; non-tender; non-distended; no hepatosplenomegaly; bowel sounds normal  Extremities: no deformity or edema; pulses normal  Skin: Warm, moist, normal turgor; no rash appreciated on limited inspection.  Neuro: Alert and grossly oriented with stable level of consciousness. Good tone.  No gross focal deficits appreciated.  Psych: Dressed in street clothes.  Calm and cooperative. Affect normal. Linear logical and goal directed.  PHQ-9 reviewed. GAD-7 10       Assessment and Plan:  Karen Barr was seen today for melena and depression.    Diagnoses and all orders for this visit:    Change in bowel movement  -     HEPATITIS PANEL, ACUTE; Future; Expected date: 06/28/2020  -     GIARDIA SCREEN,FECAL; Future; Expected date: 06/28/2020  -     LEUKOCYTES, FECAL; Future; Expected date: 06/28/2020  -     C DIFFICILE BY PCR; Future; Expected date: 06/28/2020  -     CRYPTOSPORIDIUM, FECAL; Future; Expected date: 06/28/2020  -     OVA AND PARASITES, FECAL; Future; Expected date: 06/28/2020  -     COMPREHENSIVE METABOLIC PANEL; Future; Expected date: 06/28/2020  -     CELIAC SCREEN; Future; Expected date: 06/28/2020  -     AMB REFERRAL TO GI LAB FOR PROCEDURE    Moderate episode of recurrent major depressive disorder (HCC)  -     sertraline (ZOLOFT) 25 mg tablet; Take one tablet by mouth daily. Indications: major depressive disorder      Concern for possible viral hepatitis vs parasite. Blood and stool studies to eval. Consider colonosocpy if work up negative.  Start zoloft low dose. Follow up 6 weeks Discussed side effects. Continue therapy.  Patient Instructions   Go to lab - blood and stool sample  Start zoloft 25mg  - half tablet for 4-6 days then increase to 1 tablet    Visit Disposition     Dispositions    ? Return in about 6 weeks (around 08/09/2020).

## 2020-07-01 ENCOUNTER — Encounter: Admit: 2020-07-01 | Discharge: 2020-07-01 | Payer: BC Managed Care – PPO

## 2020-07-01 ENCOUNTER — Ambulatory Visit: Admit: 2020-07-01 | Discharge: 2020-07-01 | Payer: BC Managed Care – PPO

## 2020-07-01 DIAGNOSIS — R198 Other specified symptoms and signs involving the digestive system and abdomen: Secondary | ICD-10-CM

## 2020-07-03 ENCOUNTER — Encounter: Admit: 2020-07-03 | Discharge: 2020-07-03 | Payer: BC Managed Care – PPO

## 2020-07-03 DIAGNOSIS — Z20822 Encounter for screening laboratory testing for COVID-19 virus in asymptomatic patient: Secondary | ICD-10-CM

## 2020-07-03 DIAGNOSIS — K921 Melena: Secondary | ICD-10-CM

## 2020-07-03 DIAGNOSIS — F32A Depression: Secondary | ICD-10-CM

## 2020-07-06 ENCOUNTER — Encounter: Admit: 2020-07-06 | Discharge: 2020-07-06 | Payer: BC Managed Care – PPO

## 2020-07-06 ENCOUNTER — Encounter: Admit: 2020-07-06 | Discharge: 2020-07-07 | Payer: BC Managed Care – PPO

## 2020-07-06 DIAGNOSIS — Z20822 Encounter for screening laboratory testing for COVID-19 virus in asymptomatic patient: Principal | ICD-10-CM

## 2020-07-06 NOTE — Progress Notes
Patient arrived to COVID clinic for COVID-19 testing 07/06/20 1443. Patient identity confirmed via photo I.D. Nasopharyngeal procedure explained to the patient.   Nasopharyngeal swab completed left  Patient education provided given and instructed patient self isolate until contacted w/ results and further instructions. CDC handout on COVID-19 given to patient.   [email or web address]      Swab collected by [facility].    Reason for testing: pre-op

## 2020-07-07 LAB — COVID-19 (SARS-COV-2) PCR

## 2020-07-08 ENCOUNTER — Encounter: Admit: 2020-07-08 | Discharge: 2020-07-08 | Payer: BC Managed Care – PPO

## 2020-07-08 DIAGNOSIS — K51211 Ulcerative (chronic) proctitis with rectal bleeding: Secondary | ICD-10-CM

## 2020-07-08 DIAGNOSIS — K921 Melena: Secondary | ICD-10-CM

## 2020-07-08 DIAGNOSIS — F32A Depression: Secondary | ICD-10-CM

## 2020-07-08 MED ORDER — LIDOCAINE (PF) 100 MG/5 ML (2 %) IV SYRG
INTRAVENOUS | 0 refills | Status: DC
Start: 2020-07-08 — End: 2020-07-08

## 2020-07-08 MED ORDER — PROPOFOL 10 MG/ML IV EMUL 50 ML (INFUSION)(AM)(OR)
INTRAVENOUS | 0 refills | Status: DC
Start: 2020-07-08 — End: 2020-07-08

## 2020-07-08 MED ORDER — MESALAMINE 1,000 MG RE SUPP
1000 mg | Freq: Every evening | RECTAL | 5 refills | 30.00000 days | Status: AC
Start: 2020-07-08 — End: ?

## 2020-07-08 NOTE — Anesthesia Post-Procedure Evaluation
Post-Anesthesia Evaluation    Name: Karen Barr      MRN: [id number]     DOB: [birth year]     Age: 34 y.o.     Sex: female   __________________________________________________________________________     Procedure Information     Anesthesia Start Date/Time: 07/08/20 1505    Procedure: COLONOSCOPY DIAGNOSTIC WITH SPECIMEN COLLECTION BY BRUSHING/ WASHING - FLEXIBLE (N/A )    Location: [facility] / [id number] OR    Surgeons: Buckles, Vinnie Level, MD          Post-Anesthesia Vitals  BP: 121/84 (11/22 1540)  Temp: 36.7 C (98.1 F) (11/22 1525)  Respirations: 16 PER MINUTE (11/22 1540)  SpO2: 100 % (11/22 1540)  SpO2 Pulse: 67 (11/22 1540)  Height: 175.3 cm (69") (11/22 1441)   Vitals Value Taken Time   BP 121/84 07/08/20 1540   Temp 36.7 C (98.1 F) 07/08/20 1525   Pulse     Respirations 16 PER MINUTE 07/08/20 1540   SpO2 100 % 07/08/20 1540   ABP     ART BP           Post Anesthesia Evaluation Note    Evaluation location: pre/post  Patient participation: recovered; patient participated in evaluation  Level of consciousness: alert  Pain management: adequate    Hydration: normovolemia  Temperature: 36.0C - 38.4C  Airway patency: adequate    Perioperative Events      Postoperative Status  Cardiovascular status: hemodynamically stable  Respiratory status: spontaneous ventilation  Additional comments: Post-Anesthesia Evaluation Attestation: I reviewed and agree the indicated post-anesthesia care was provided. I have reviewed key portions of the indicated post anesthesia care. I have examined the patient's vitals, physical status, and complications and agree with what is documented.    Staff name:  Carl Best, MD Date:  07/08/2020            Perioperative Events  Perioperative Event: No  Emergency Case Activation: No

## 2020-07-09 ENCOUNTER — Encounter: Admit: 2020-07-09 | Discharge: 2020-07-09 | Payer: BC Managed Care – PPO

## 2020-07-09 DIAGNOSIS — K921 Melena: Secondary | ICD-10-CM

## 2020-07-09 DIAGNOSIS — F32A Depression: Secondary | ICD-10-CM

## 2020-07-30 ENCOUNTER — Encounter: Admit: 2020-07-30 | Discharge: 2020-07-30 | Payer: BC Managed Care – PPO

## 2020-07-31 ENCOUNTER — Encounter: Admit: 2020-07-31 | Discharge: 2020-07-31 | Payer: BC Managed Care – PPO

## 2020-07-31 MED FILL — SERTRALINE 25 MG PO TAB: 25 mg | ORAL | 30 days supply | Qty: 30 | Fill #2 | Status: AC

## 2020-08-22 ENCOUNTER — Encounter: Admit: 2020-08-22 | Discharge: 2020-08-22 | Payer: Commercial Managed Care - Pharmacy Benefit Manager

## 2020-08-22 ENCOUNTER — Ambulatory Visit: Admit: 2020-08-22 | Discharge: 2020-08-23 | Payer: No Typology Code available for payment source

## 2020-08-22 DIAGNOSIS — F331 Major depressive disorder, recurrent, moderate: Secondary | ICD-10-CM

## 2020-08-22 DIAGNOSIS — K921 Melena: Secondary | ICD-10-CM

## 2020-08-22 DIAGNOSIS — F32A Depression: Secondary | ICD-10-CM

## 2020-08-22 DIAGNOSIS — Z23 Encounter for immunization: Secondary | ICD-10-CM

## 2020-08-22 MED ORDER — LETROZOLE 2.5 MG PO TAB
ORAL_TABLET | ORAL | 0 refills | 16.50000 days | Status: AC
Start: 2020-08-22 — End: ?
  Filled 2020-09-23: qty 15, 84d supply, fill #1

## 2020-08-22 MED ORDER — SERTRALINE 25 MG PO TAB
25 mg | ORAL_TABLET | Freq: Every day | ORAL | 3 refills | Status: AC
Start: 2020-08-22 — End: ?

## 2020-08-22 NOTE — Progress Notes
Patient presented to clinic for OV, MODERNA booster et flu were administered in RD per patient request, patient tolerated well, no s/s of adverse reaction at this time, VIS given, consent obtained.

## 2020-08-22 NOTE — Progress Notes
Date of Service: 08/22/2020    Subjective:             Karen Barr is a 35 y.o. female.    History of Present Illness  Here for follow up for bloody diarrhea. She had colonoscopy done which showed UC 07/08/2020. Was started on mesalamine but has not had follow up with GI. Was not told how long to use medication. She stopped when she ran out a few days ago. No weight loss.  Doing very well on current dose of zoloft. Feels much better and able to function well. Would like to continue medication.  Still wanting to try to get pregnant with wife. They have been doing times ovulation and using home insemination kit. We did clomiphene in past. Would like to try using again to increase success.     Review of Systems   Constitutional: Negative for chills, fever and unexpected weight change.   Gastrointestinal: Positive for blood in stool and diarrhea. Negative for nausea.   Skin: Negative.    Psychiatric/Behavioral: Negative.          Objective:         ? cyanocobalamin (vitamin B-12) (VITAMIN B-12 PO) Take  by mouth.   ? ergocalciferol (vitamin D2) (VITAMIN D PO) Take  by mouth.   ? ferrous sulfate (IRON PO) Take  by mouth.   ? IBUPROFEN PO Take  by mouth.   ? letrozole (FEMARA) 2.5 mg tablet Take for 5 days, start on day 3 of your menstrual cycle   ? mesalamine (CANASA) 1,000 mg rectal suppository Insert or Apply one suppository to rectal area as directed at bedtime daily.   ? sertraline (ZOLOFT) 25 mg tablet Take one tablet by mouth daily.     Vitals:    08/22/20 1041   BP: 118/77   Pulse: 71   Resp: 16   SpO2: 100%   Weight: 105.7 kg (233 lb)   Height: 175.3 cm (69)     Body mass index is 34.41 kg/m?Marland Kitchen     Physical Exam  General: No acute distress, well-appearing.  Eyes: sclera anicteric; conjunctiva clear and well perfused  HEENT: Atraumatic, normocephalic, MMM  Neck: no JVD or nodes; soft and supple; TML  Lungs: CTA bilat. Normal respiratory effort.  Symmetric expansion upon inspiration.  CV: S1, S2 normal with regular rate, without rub, gallop or murmur.  Skin: Warm, moist, normal turgor; no rash appreciated on limited inspection.  Neuro: Alert and grossly oriented with stable level of consciousness. Good tone.  No gross focal deficits appreciated.  Psych: Dressed in street clothes.  Calm and cooperative. Affect normal. Linear logical and goal directed.         Assessment and Plan:  [name] was seen today for follow up and depression.    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    Infertility, female  -     letrozole (FEMARA) 2.5 mg tablet; Take for 5 days, start on day 3 of your menstrual cycle    Need for vaccination  -     FLU VACCINE =>6 MONTHS QUADRIVALENT PF  -     COVID-19 (MODERNA BOOSTER) SARSCOV2 VAC 50MCG/0.25ML  GI referral and restart mesalamine  Hold off trying to get pregnant until GI visit  Patient Instructions   Restart mesalamine  Continue zoloft  Schedule with gastroenterology  Take letrozole next cycle as directed        Problem   Moderate Episode of Recurrent Major Depressive Disorder (Hcc)   Ulcerative (Chronic) Proctitis With Rectal Bleeding (Hcc)

## 2020-08-22 NOTE — Patient Instructions
Restart mesalamine  Continue zoloft  Schedule with gastroenterology  Take letrozole next cycle as directed

## 2020-08-23 DIAGNOSIS — N979 Female infertility, unspecified: Secondary | ICD-10-CM

## 2020-08-23 DIAGNOSIS — K51211 Ulcerative (chronic) proctitis with rectal bleeding: Secondary | ICD-10-CM

## 2020-09-11 ENCOUNTER — Encounter: Admit: 2020-09-11 | Discharge: 2020-09-11 | Payer: Commercial Managed Care - Pharmacy Benefit Manager

## 2020-09-11 NOTE — Telephone Encounter
Spoke w/ PT and stated that PT will have F/U w/ surgeon in ten days. Told PT to C/B if she needs an appt or has any concerns. PT also stated that pharmacy was confused on RX directions. Pharmacy called about a week ago and have not heard back yet. Pharmacy is seeking clarification on the instructions for RX sent in. Routing to Dr. Pilar Plate.

## 2020-09-11 NOTE — Telephone Encounter
Pt. returning call. LVM [id number]

## 2020-09-12 ENCOUNTER — Encounter: Admit: 2020-09-12 | Discharge: 2020-09-12 | Payer: Commercial Managed Care - Pharmacy Benefit Manager

## 2020-09-13 ENCOUNTER — Encounter: Admit: 2020-09-13 | Discharge: 2020-09-13 | Payer: Commercial Managed Care - Pharmacy Benefit Manager

## 2020-09-15 ENCOUNTER — Encounter: Admit: 2020-09-15 | Discharge: 2020-09-15 | Payer: Commercial Managed Care - Pharmacy Benefit Manager

## 2020-09-16 ENCOUNTER — Encounter: Admit: 2020-09-16 | Discharge: 2020-09-16 | Payer: Commercial Managed Care - Pharmacy Benefit Manager

## 2020-09-17 ENCOUNTER — Encounter: Admit: 2020-09-17 | Discharge: 2020-09-17 | Payer: No Typology Code available for payment source

## 2020-09-17 NOTE — Telephone Encounter
Routing to Dr. Pilar Plate for clarification.

## 2020-09-17 NOTE — Telephone Encounter
[facility] requesting a call back with the max daily dose per day on letrozole [facility]) 2.5 mg tablet. LVM [id number]

## 2020-09-17 NOTE — Telephone Encounter
Prescription was already sent to [facility] pharmacy

## 2020-09-18 ENCOUNTER — Encounter: Admit: 2020-09-18 | Discharge: 2020-09-18 | Payer: No Typology Code available for payment source

## 2020-09-18 NOTE — Telephone Encounter
TC to pt, provided pt with number to call GI Lab to schedule appt.    RT: Dr. Pilar Plate  RT: Jim Desanctis RN Lorain Childes

## 2020-09-18 NOTE — Telephone Encounter
Regarding: Medications  ----- Message from [name], MD sent at 09/13/2020  2:36 PM CST -----  Could you please assist patient with getting a GI appointment? Referral was placed 08/22/20     ----- Message sent from [name], MD to [name] at 09/13/2020  2:36 PM -----   I already received the records from your recent surgery. So sorry you had to go through that!  Are you ok with me sending the letrozole prescription to our pharmacy here at [facility]? They should be able to mail it to you. I've had so many problems with [facility] and [facility] lately. I never received any notification that they needed clarification.   I will see if one of our patient reps can help you with the GI appointment.      ----- Message -----       From:Karen Barr       Sent:09/12/2020  1:55 PM CST         EV:[id number] Kem Kays, MD    Subject:Medications    Hello Dr Pilar Plate,  The medication I'm having trouble with is the Letrozol. The pharmacy said they needed you to send clarification regarding the instructions for the prescription, and will not fill it until they receive clarification.   I had my gallbladder removed Tuesday, and was prescribed Zofran and Oxycodone/Acetaminophen 5-325 MG. I took them they day of and the day after surgery but I am no longer taking them now, and I'm taking Tylenol instead.  The surgeon wants me to have a post-op appointment 2 weeks from now. Do you need me to get anything from her for your records?  Also, I haven't heard from gastroenterology about my colitis issue.  Sorry for the long note, hope you are well.  Karen Barr

## 2020-09-23 ENCOUNTER — Encounter: Admit: 2020-09-23 | Discharge: 2020-09-23 | Payer: No Typology Code available for payment source

## 2020-11-07 ENCOUNTER — Encounter: Admit: 2020-11-07 | Discharge: 2020-11-07 | Payer: No Typology Code available for payment source

## 2020-11-07 DIAGNOSIS — N926 Irregular menstruation, unspecified: Secondary | ICD-10-CM

## 2020-12-24 ENCOUNTER — Encounter: Admit: 2020-12-24 | Discharge: 2020-12-24 | Payer: No Typology Code available for payment source

## 2020-12-25 ENCOUNTER — Encounter: Admit: 2020-12-25 | Discharge: 2020-12-25 | Payer: No Typology Code available for payment source

## 2021-02-21 ENCOUNTER — Encounter: Admit: 2021-02-21 | Discharge: 2021-02-21 | Payer: No Typology Code available for payment source

## 2021-02-21 DIAGNOSIS — Z3201 Encounter for pregnancy test, result positive: Secondary | ICD-10-CM

## 2021-02-21 NOTE — Telephone Encounter
Pc to pt, states last menstrual cycle is January 26, 2021, made appt for 8/25, advised for healthy eating, no drinking alcohol/smoking, light exercise ok, no heavy lifting, or activity where pt may fall, prenatal ok may want to take after eating. Pt advised for precautions of miscarriage. Pt first pregnancy. Pt advised swimming ok no baths over 100 degrees or hot tubs. Notify PCP if severe cramping/bleeding sometimes small spots, slight cramping during implantation but to notify PCP. Pt. agreeable and verbalizes understanding.

## 2021-02-21 NOTE — Telephone Encounter
Pt states she missed a call from someone. Requesting a call back to discuss positive pregnancy test. LVM [id number]

## 2021-02-21 NOTE — Telephone Encounter
Pt. states she had an at home pregnancy test and it was positive. States she would like to get a blood test to confirm. States this is her first pregnancy. LVM [id number]

## 2021-02-21 NOTE — Telephone Encounter
Beta HCG ordered

## 2021-02-24 ENCOUNTER — Encounter: Admit: 2021-02-24 | Discharge: 2021-02-24 | Payer: No Typology Code available for payment source

## 2021-02-24 ENCOUNTER — Ambulatory Visit: Admit: 2021-02-24 | Discharge: 2021-02-24 | Payer: No Typology Code available for payment source

## 2021-02-24 DIAGNOSIS — Z3201 Encounter for pregnancy test, result positive: Secondary | ICD-10-CM

## 2021-02-24 LAB — BETA-HCG: BETA-HCG: 543 U/L — ABNORMAL HIGH (ref <5)

## 2021-02-24 LAB — CBC AND DIFF
ABSOLUTE BASO COUNT: 0 K/UL (ref 0–0.20)
ABSOLUTE EOS COUNT: 0.1 K/UL (ref 0–0.45)
ABSOLUTE LYMPH COUNT: 1.4 K/UL (ref 1.0–4.8)
ABSOLUTE MONO COUNT: 0.2 K/UL (ref 0–0.80)
ABSOLUTE NEUTROPHIL: 5.1 K/UL (ref 1.8–7.0)
BASOPHILS %: 0 % (ref 0–2)
EOSINOPHILS %: 2 % (ref 0–5)
HEMATOCRIT: 37 % (ref 36–45)
LYMPHOCYTES %: 20 % — ABNORMAL LOW (ref 24–44)
MCH: 27 pg (ref 26–34)
MCHC: 32 g/dL (ref 32.0–36.0)
MCV: 83 FL (ref 80–100)
MONOCYTES %: 3 % — ABNORMAL LOW (ref 4–12)
MPV: 8.2 FL (ref 7–11)
NEUTROPHILS %: 75 % (ref 41–77)
PLATELET COUNT: 416 K/UL — ABNORMAL HIGH (ref 150–400)
RBC COUNT: 4.4 M/UL (ref 4.0–5.0)
RDW: 13 % (ref 11–15)
WBC COUNT: 6.9 K/UL (ref 4.5–11.0)

## 2021-02-24 LAB — RUBELLA AB IGG

## 2021-02-24 LAB — CHLAM/NG PCR URINE
CHLAMYDIA TRACH PCR: NEGATIVE
NEISSERIA GONORROEAE PCR: NEGATIVE

## 2021-02-24 LAB — SYPHILIS AB SCREEN: SYPHILIS AB, TOTAL: NEGATIVE

## 2021-02-24 LAB — TSH W-FREE T4 REFLEX DURING PREGNANCY: TSH W FREE T4 REFLEX DURING PREGNANCY: 1.5 u[IU]/mL (ref 0.35–3.00)

## 2021-02-24 LAB — HEPATITIS B SURFACE AG

## 2021-02-24 LAB — HIV 1& 2 AG-AB SCRN W REFLEX HIV 1 PCR QUANT

## 2021-02-27 ENCOUNTER — Ambulatory Visit: Admit: 2021-02-27 | Discharge: 2021-02-27 | Payer: No Typology Code available for payment source

## 2021-02-27 ENCOUNTER — Encounter: Admit: 2021-02-27 | Discharge: 2021-02-27 | Payer: No Typology Code available for payment source

## 2021-02-27 DIAGNOSIS — Z3201 Encounter for pregnancy test, result positive: Secondary | ICD-10-CM

## 2021-02-27 DIAGNOSIS — N926 Irregular menstruation, unspecified: Secondary | ICD-10-CM

## 2021-02-27 LAB — BETA-HCG: BETA-HCG: 232 U/L — ABNORMAL HIGH (ref <5)

## 2021-03-09 ENCOUNTER — Encounter: Admit: 2021-03-09 | Discharge: 2021-03-09 | Payer: No Typology Code available for payment source

## 2021-04-06 ENCOUNTER — Emergency Department: Admit: 2021-04-06 | Discharge: 2021-04-06 | Payer: No Typology Code available for payment source

## 2021-04-06 ENCOUNTER — Encounter: Admit: 2021-04-06 | Discharge: 2021-04-06 | Payer: No Typology Code available for payment source

## 2021-04-06 DIAGNOSIS — F32A Depression: Secondary | ICD-10-CM

## 2021-04-06 DIAGNOSIS — M4802 Spinal stenosis, cervical region: Secondary | ICD-10-CM

## 2021-04-06 DIAGNOSIS — K921 Melena: Secondary | ICD-10-CM

## 2021-04-06 MED ORDER — METHYLPREDNISOLONE 4 MG PO DSPK
ORAL_TABLET | 0 refills | Status: AC
Start: 2021-04-06 — End: ?

## 2021-04-06 MED ORDER — ACETAMINOPHEN 500 MG PO TAB
1000 mg | Freq: Once | ORAL | 0 refills | Status: CP
Start: 2021-04-06 — End: ?
  Administered 2021-04-06: 16:00:00 1000 mg via ORAL

## 2021-04-06 MED ORDER — LIDOCAINE 5 % TP PTMD
1 | Freq: Once | TOPICAL | 0 refills | Status: DC
Start: 2021-04-06 — End: 2021-04-06
  Administered 2021-04-06: 16:00:00 1 via TOPICAL

## 2021-04-06 NOTE — ED Notes
Pt presents to the ED with complaints of neck/shoulder pain. Pt reports this started earlier this week and has progressively gotten worse. Pt is currently [redacted]week pregnant and reports she is having a hard time finding a medication that will relieve the pain without hurting the fetus. Pt reports she has had issues with pinched nerves in the past and had back/knee surgeries. Pt denies any other medical complaints. A&Ox4. Skin warm, dry, intact. Resp even and unlabored. Pt attached to all monitors. Pt resting comfortably. Call light within reach. Pt verbalized understanding of use. Cart in lowest position, wheels are locked, and bed rails up x2.

## 2021-04-06 NOTE — ED Notes
Discharge instructions discussed with pt. Pt verbalizes understanding of instructions given. Pt A&Ox4, breathing unlabored, ambulates steadily.

## 2021-04-07 ENCOUNTER — Encounter: Admit: 2021-04-07 | Discharge: 2021-04-07 | Payer: No Typology Code available for payment source

## 2021-04-08 ENCOUNTER — Encounter: Admit: 2021-04-08 | Discharge: 2021-04-08 | Payer: No Typology Code available for payment source

## 2021-04-08 NOTE — Telephone Encounter
Irving Burton requesting a call back. States pt. was in the ED the other day for back pain. States she was diagnosed with spinal stenosis, pregnant and unable to do a lot. Inquiring what options they have. States the [facility] is unable to give an injection on the pt. Requesting a call back at [phone]. LVM [id number]

## 2021-04-08 NOTE — Telephone Encounter
ED Discharge Follow Up  Reached patient: No; pt LM with PCP office; scheduled 04/10/21 with PCP office, and 04/11/21 with [facility]  Admission Information  Hospital Name : [facility]  ED Admission Date: 04/06/21 ED Discharge Date: 04/06/21 Admission Diagnosis: Pain,  neck  Discharge Diagnosis Cervical stenosis of spine  Hospital Services: Unplanned  Today's call is 2 (calendar) days post discharge    Medication Reconciliation  Changes to pre-ED visit medications? No  Were new prescriptions filled?N/A  Meds reviewed and reconciled? Yes  ? cyanocobalamin (vitamin B-12) (VITAMIN B-12 PO) Take  by mouth.   ? ergocalciferol (vitamin D2) (VITAMIN D PO) Take  by mouth.   ? ferrous sulfate (IRON PO) Take  by mouth.   ? IBUPROFEN PO Take  by mouth.   ? letrozole [facility]) 2.5 mg tablet Take one tablet by mouth daily for 5 days, start on day 3 of your menstrual cycle. (To be taken on cycle days 3-7)   ? mesalamine (CANASA) 1,000 mg rectal suppository Insert or Apply one suppository to rectal area as directed at bedtime daily.   ? methylPREDNIsolone (MEDROL (PAK)) 4 mg tablet Take medication as directed on package for 6 days. Take with food.   ? sertraline (ZOLOFT) 25 mg tablet Take one tablet by mouth daily.       Scheduling Follow-up Appointment  Upcoming appointment date and time and with whom scheduled:   Future Appointments   Date Time Provider Department Center   04/10/2021  2:40 PM [name], MD [facility]   04/11/2021 11:30 AM Evelina Bucy, MD [facility]   05/30/2021  9:20 AM [name], MD [facility]     When was patient?s last PCP visit: 08/22/2020  PCP primary location: [facility]  PCP appointment scheduled? Yes, Date: prenatal appt 04/10/21   Specialist appointment scheduled? Yes, with [facility] 04/11/21  Both PCP and Specialist appointment scheduled: Yes  Is assistance with transportation needed? No  MyChart message sent? Active in MyChart. No message sent. ED Communication   Did Pt call Clinic prior to going to ED? No      Lavell Islam

## 2021-04-10 ENCOUNTER — Encounter: Admit: 2021-04-10 | Discharge: 2021-04-10 | Payer: No Typology Code available for payment source

## 2021-04-10 ENCOUNTER — Ambulatory Visit: Admit: 2021-04-10 | Discharge: 2021-04-10 | Payer: No Typology Code available for payment source

## 2021-04-10 DIAGNOSIS — Z3401 Encounter for supervision of normal first pregnancy, first trimester: Secondary | ICD-10-CM

## 2021-04-10 DIAGNOSIS — Z3491 Encounter for supervision of normal pregnancy, unspecified, first trimester: Secondary | ICD-10-CM

## 2021-04-10 DIAGNOSIS — M4802 Spinal stenosis, cervical region: Secondary | ICD-10-CM

## 2021-04-10 DIAGNOSIS — K51211 Ulcerative (chronic) proctitis with rectal bleeding: Secondary | ICD-10-CM

## 2021-04-10 DIAGNOSIS — K921 Melena: Secondary | ICD-10-CM

## 2021-04-10 DIAGNOSIS — F32A Depression: Secondary | ICD-10-CM

## 2021-04-10 MED ORDER — MESALAMINE 1,000 MG RE SUPP
1000 mg | Freq: Every evening | RECTAL | 5 refills | 30.00000 days | Status: AC
Start: 2021-04-10 — End: ?

## 2021-04-10 NOTE — Telephone Encounter
Patient wanting to know if there is any way to be seen by PCP earlier int he week or even in the same day as they have a flight scheduled for a wedding on same day as existing appointment on 10/14.    Also need wanting to discuss who future prenatal appointments should be with as PCP has no family med clinic appointments available until January 2023

## 2021-04-10 NOTE — Patient Instructions
[email or web address]

## 2021-04-10 NOTE — Progress Notes
OB History and Physical      HPI:     Karen Barr is a 35 y.o. @ [redacted]w[redacted]d by LMP, Estimated Date of Delivery: 10/27/21.  She is being seen today for her first obstetrical visit.  Her last period was normal.  This is a planned pregnancy. LMP 01/26/2021  She was also seen for the following issues:  Recently seen in the ED for lower cervical/thoracic pain - given medrol dose pack for cervical spinal stenosis. Has appt with spine center tomorrow  Has had recent episode of colitis but has not followed up with GI.    Taking a LOA from going to school  Cats at home  Spouse:  Irving Burton  Patient does intend to breast-feed.     No FM, no VB, no LOF, no CTX    +fatigue, mild nausea -resolved, +headaches  Review of Systems:     All other systems reviewed and are negative. Denies fevers, chills, nausea, vomiting, CP, SOA, abdominal pain, urinary problems or dysuria, diarrhea, constipation, edema.    Obstetrical History:     OB History   Gravida Para Term Preterm AB Living   1 0 0 0 0 0   SAB IAB Ectopic Multiple Live Births   0 0 0 0 0      # Outcome Date GA Lbr Len/2nd Weight Sex Delivery Anes PTL Lv   1 Current                              Gynecologic History:     Pap:  Negative done 07/30/2017  STD:  Negative  Menstrual History:  Menstrual History   ? Age at menarche     ? Duration of menses     ? Length of Cycle     ? Menopause?     ? Age at menopause          Medical History:     Medical History:   Diagnosis Date   ? Blood in stool    ? Depression        No DM, HTN, HLD, CAD, thyroid disease, anxiety, depression, bleeding/clotting disorders, blood transfusions, seizures, IBS, endometriosis.    Surgical History:     Surgical History:   Procedure Laterality Date   ? Katrinka Blazing and nephew fast fix 360* ARTHROSCOPY LEFTKNEE WITH PARTIAL LATERAL  MENISCECTOMY Left 03/19/2020    Performed by Talmadge Chad, MD at [facility] OR   ? COLONOSCOPY DIAGNOSTIC WITH SPECIMEN COLLECTION BY BRUSHING/ WASHING - FLEXIBLE N/A 07/08/2020 Performed by Buckles, Vinnie Level, MD at [facility] OR   ? HX SHOULDER ARTHROSCOPY     ? HX TONSILLECTOMY         Family History:     Family History   Problem Relation Age of Onset   ? Diabetes Mother    ? High Cholesterol Mother    ? Hypertension Mother    ? Miscarriages Mother    ? Diabetes Sister    ? Cancer Maternal Grandmother    ? High Cholesterol Maternal Grandmother        Denies family h/o DM, asthma, HTN, CA, seizure d/o, bleeding/clotting d/o, depression, birth defects.    Social History:        Social History     Socioeconomic History   ? Marital status: Married     Spouse name: Irving Burton   ? Number of children: 0   Occupational History   ?  Occupation: Arboriculturist   Tobacco Use   ? Smoking status: Never Smoker   ? Smokeless tobacco: Never Used   Substance and Sexual Activity   ? Alcohol use: Yes   ? Drug use: No   ? Sexual activity: Yes     Partners: Female       Denies tobacco, ETOH, drug use.    Allergies:     No Known Allergies      Current Medications:     Outpatient Encounter Medications as of 04/10/2021   Medication Sig Dispense Refill   ? [DISCONTINUED] cyanocobalamin (vitamin B-12) (VITAMIN B-12 PO) Take  by mouth.     ? ergocalciferol (vitamin D2) (VITAMIN D PO) Take  by mouth.     ? [DISCONTINUED] ferrous sulfate (IRON PO) Take  by mouth.     ? [DISCONTINUED] IBUPROFEN PO Take  by mouth.     ? [DISCONTINUED] letrozole (FEMARA) 2.5 mg tablet Take one tablet by mouth daily for 5 days, start on day 3 of your menstrual cycle. (To be taken on cycle days 3-7) 30 tablet 0   ? mesalamine (CANASA) 1,000 mg rectal suppository Insert or Apply one suppository to rectal area as directed at bedtime daily. 30 suppository 5   ? [DISCONTINUED] mesalamine (CANASA) 1,000 mg rectal suppository Insert or Apply one suppository to rectal area as directed at bedtime daily. 30 suppository 5   ? methylPREDNIsolone (MEDROL (PAK)) 4 mg tablet Take medication as directed on package for 6 days. Take with food. 21 tablet 0   ? sertraline (ZOLOFT) 25 mg tablet Take one tablet by mouth daily. 90 tablet 3     No facility-administered encounter medications on file as of 04/10/2021.         Exam:     Vitals:    04/10/21 1432   BP: 106/83   Pulse: 97   Resp: 16   SpO2: 100%   PainSc: Six   Weight: 103.9 kg (229 lb)   Height: 175.3 cm (5' 9.02)       Gen: NAD  CV: RRR  Chest: CTAB  Abd: Gravid, nttp  Ext: No LE edema, nttp LE  Limited US showed single IUP with +FM and +CA  FHR: 150s    CAFC sono:   ordered    Prenatal Labs:  Blood type A neg  Ab neg  Rubella equivocal  HepB neg  Syph B neg  GC/CT neg/neg  HIV neg    Assessment:     35 y.o. G1P0 @ [redacted]w[redacted]d here for initial prenatal visit.         1. Encounter for supervision of normal first pregnancy in first trimester  ULTRASOUND [facility] CLINIC ORDER    RUBELLA AB IGG   2. 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 Cervical spinal stenosis         Plan:            Initial prenatal labs  Prenatal vitamins  Role of ultrasound in pregnancy discussed  Appropriate weight gain in pregnancy discussed - recommended weight gain of 20-25lb given BMI of 33   Less than 18.5:  28-40lb  18.5-24.9:  25-35lb  25-29.9:  15-25lb  30+:  11-20lb    NT/NIPS @ 11-13 wga  Growth/anatomy/CL scan at 20 wga  Next appt in 4-6 week(s)  Next appointment: No follow-ups on file.    Orders Placed This Encounter   ? ULTRASOUND [facility] CLINIC ORDER   ? RUBELLA AB IGG (Today)   ? GASTROENTEROLOGY   ? mesalamine (CANASA) 1,000 mg rectal suppository         [name], MD

## 2021-04-11 ENCOUNTER — Ambulatory Visit: Admit: 2021-04-11 | Discharge: 2021-04-11 | Payer: No Typology Code available for payment source

## 2021-04-11 ENCOUNTER — Encounter: Admit: 2021-04-11 | Discharge: 2021-04-11 | Payer: No Typology Code available for payment source

## 2021-04-11 DIAGNOSIS — M62838 Other muscle spasm: Secondary | ICD-10-CM

## 2021-04-11 DIAGNOSIS — M5412 Radiculopathy, cervical region: Secondary | ICD-10-CM

## 2021-04-11 DIAGNOSIS — M5030 Other cervical disc degeneration, unspecified cervical region: Secondary | ICD-10-CM

## 2021-04-11 DIAGNOSIS — F32A Depression: Secondary | ICD-10-CM

## 2021-04-11 DIAGNOSIS — M7918 Myalgia, other site: Secondary | ICD-10-CM

## 2021-04-11 DIAGNOSIS — K921 Melena: Secondary | ICD-10-CM

## 2021-04-11 MED ORDER — TRIAMCINOLONE ACETONIDE 40 MG/ML IJ SUSP
80 mg | Freq: Once | EPIDURAL | 0 refills | Status: CP
Start: 2021-04-11 — End: ?
  Administered 2021-04-11: 18:00:00 80 mg via EPIDURAL

## 2021-04-11 MED ORDER — IOHEXOL 240 MG IODINE/ML IV SOLN
20 mL | Freq: Once | EPIDURAL | 0 refills | Status: CP
Start: 2021-04-11 — End: ?
  Administered 2021-04-11: 18:00:00 20 mL via EPIDURAL

## 2021-04-11 NOTE — Progress Notes

## 2021-04-11 NOTE — Progress Notes
SPINE CENTER  INTERVENTIONAL PAIN PROCEDURE HISTORY AND PHYSICAL    Chief Complaint: Pain    HISTORY OF PRESENT ILLNESS:  Neck pain with UE radicular pain.     Medical History:   Diagnosis Date   ? Blood in stool    ? Depression        Surgical History:   Procedure Laterality Date   ? Katrinka Blazing and nephew fast fix 360* ARTHROSCOPY LEFTKNEE WITH PARTIAL LATERAL  MENISCECTOMY Left 03/19/2020    Performed by Talmadge Chad, MD at [facility] OR   ? COLONOSCOPY DIAGNOSTIC WITH SPECIMEN COLLECTION BY BRUSHING/ WASHING - FLEXIBLE N/A 07/08/2020    Performed by Buckles, Vinnie Level, MD at [facility] OR   ? HX SHOULDER ARTHROSCOPY     ? HX TONSILLECTOMY         family history includes Cancer in her maternal grandmother; Diabetes in her mother and sister; High Cholesterol in her maternal grandmother and mother; Hypertension in her mother; Miscarriages in her mother.    Social History     Socioeconomic History   ? Marital status: Married     Spouse name: Irving Burton   ? Number of children: 0   Occupational History   ? Occupation: Arboriculturist   Tobacco Use   ? Smoking status: Never Smoker   ? Smokeless tobacco: Never Used   Substance and Sexual Activity   ? Alcohol use: Yes   ? Drug use: No   ? Sexual activity: Yes     Partners: Female       No Known Allergies    Vitals:    04/11/21 1235   PainSc: Ten     Pain Score: Ten       REVIEW OF SYSTEMS: 10 point ROS obtained and negative      PHYSICAL EXAM:    General: Alert, cooperative, no acute distress.  HEENT: Normocephalic, atraumatic.  Neck: Supple.  Lungs: Unlabored respirations, bilateral and equal chest excursion.  Heart: Regular rate.  Skin: Warm and dry to touch.  Abdomen: Nondistended.  MSK: No deformity.  Neurological: Alert and oriented x3.        IMPRESSION:    1. Cervical radiculopathy    2. DDD (degenerative disc disease), cervical         PLAN: Cervical Epidural Steroid Injection C7-T1    Patient is [redacted] weeks pregnant. We discussed risks/benefits of XR exposure and medications such as steroids at length. Patient wishes to proceed. Will use lead over abdominal/gravid area. Minimize fluoro and use lower dose.

## 2021-04-11 NOTE — Patient Instructions
Procedure Completed Today: Cervical Epidural Steroid Injection    Important information following your procedure today: You may drive today    Pain relief may not be immediate. It is possible you may even experience an increase in pain during the first 24-48 hours followed by a gradual decrease of your pain.  Though the procedure is generally safe and complications are rare, we do ask that you be aware of any of the following:   Any swelling, persistent redness, new bleeding, or drainage from the site of the injection.  You should not experience a severe headache.  You should not run a fever over 101? F.  New onset of sharp, severe back & or neck pain.  New onset of upper or lower extremity numbness or weakness.  New difficulty controlling bowel or bladder function after the injection.  New shortness of breath.    If any of these occur, please call to report this occurrence to a nurse at [phone]. If you are calling after 4:00 p.m., on weekends or holidays please call [phone] and ask to have the resident physician on call for the physician paged or go to your local emergency room.  You may experience soreness at the injection site. Ice can be applied at 20 minute intervals. Avoid application of direct heat, hot showers or hot tubs today.  Avoid strenuous activity today. You may resume your regular activities and exercise tomorrow.  Patients with diabetes may see an elevation in blood sugars for 7-10 days after the injection. It is important to pay close attention to your diet, check your blood sugars daily and report extreme elevations to the physician that treats your diabetes.  Patients taking a daily blood thinner can resume their regular dose this evening.  It is important that you take all medications ordered by your pain physician. Taking medication as ordered is an important part of your pain care plan. If you cannot continue the medication plan, please notify the physician.     Possible side effects to steroids that may occur:  Flushing or redness of the face  Irritability  Fluid retention  Change in women?s menses    The following medications were used: Triamcinolone   and Contrast Dye

## 2021-04-11 NOTE — Progress Notes
Dear Dr. [name],    I appreciate your kind referral of Karen Barr for evaluation of pain. Please see my note below for the full details of the evaluation and management plan.    Thank you,    Evelina Bucy, MD        [facility] Inter[facility]  NEW PATIENT HISTORY AND PHYSICAL  Subjective     Chief Complaint:   Chief Complaint   Patient presents with   ? Middle Back - Pain   ? New Patient     MIDDLE BACK PAIN          HPI: Karen Barr is a 35 y.o. female who  has a past medical history of Blood in stool and Depression. who presents for evaluation.    The pain is in the cervical spine. Patient presented to the ED on Sunday of this week with excruciating pain. She received a medrol dose pack and got an MRI at that time. She states she was having shooting pain and down the left arm, primarily in the ulnar distribution. Since that time, pain has begun to spread to involve the median nerve distribution as well.     Pain started: 1 month ago    Initial inciting injury or event: No    Numbness/tingling: Left arm    The pain averages Neck or upper back      The pain is described as Numb-like, Stabbing, Tiring, Nagging, Sharp, Unbearable      The pain is exacerbated by Other (comment) Lying down  Sitting, axial movements of neck    The pain is partially alleviated by Rest, Heat             PRIOR MEDICATIONS:   Effective      Ineffective  Medrol dose pack was not beneficial  Acetaminophen    Unable to tolerate      Never  Gabapentin   Lyrica  Ami/Nortriptyline  Cymbalta  Tizanidine    PRIOR INTERVENTIONS:   Effective      Ineffective          [name] denies any recent fevers, chills, infection, antibiotics, bowel or bladder incontinence, saddle anesthesia, bleeding issues, or recent anticoagulant.     ROS: All 14 systems reviewed and found to be negative except as above and as follows.    Past Medical History:  Medical History:   Diagnosis Date   ? Blood in stool    ? Depression        Family History:  Family History   Problem Relation Age of Onset   ? Diabetes Mother    ? High Cholesterol Mother    ? Hypertension Mother    ? Miscarriages Mother    ? Diabetes Sister    ? Cancer Maternal Grandmother    ? High Cholesterol Maternal Grandmother        Social History:  Lives in [place] [address]    Social History     Socioeconomic History   ? Marital status: Married     Spouse name: Irving Burton   ? Number of children: 0   Occupational History   ? Occupation: Arboriculturist   Tobacco Use   ? Smoking status: Never Smoker   ? Smokeless tobacco: Never Used   Substance and Sexual Activity   ? Alcohol use: Yes   ? Drug use: No   ? Sexual activity: Yes     Partners: Female  Allergies:  No Known Allergies    Medications:    Current Outpatient Medications:   ?  ergocalciferol (vitamin D2) (VITAMIN D PO), Take  by mouth., Disp: , Rfl:   ?  mesalamine (CANASA) 1,000 mg rectal suppository, Insert or Apply one suppository to rectal area as directed at bedtime daily., Disp: 30 suppository, Rfl: 5  ?  methylPREDNIsolone (MEDROL (PAK)) 4 mg tablet, Take medication as directed on package for 6 days. Take with food., Disp: 21 tablet, Rfl: 0  ?  sertraline (ZOLOFT) 25 mg tablet, Take one tablet by mouth daily., Disp: 90 tablet, Rfl: 3    Physical examination:   BP (!) 147/92 (BP Source: Arm, Right Upper, Patient Position: Sitting)  - Pulse 106  - Ht 175.3 cm (5' 9)  - Wt 103.9 kg (229 lb)  - LMP 01/27/2020 (Exact Date) Comment: aprox 10 weeks preg - SpO2 100%  - BMI 33.82 kg/m?   Pain Score: Nine    General: The patient is a well-developed, well nourished 35 y.o. female in no acute distress.   HEENT: Head is normocephalic and atraumatic. Pupils are equal and reactive to light bilaterally.   Cardiac: Based on palpation, pulse appears to be regular rate and rhythm.   Pulmonary: The patient has unlabored respirations and bilateral symmetric chest excursion.   Abdomen: Soft, nontender, and nondistended. There is no rebound or guarding.   Extremities: No clubbing, cyanosis, or edema.     Neurologic:   The patient is alert and oriented times 3.   Cranial nerves II through XII are intact without any focal deficits.     Musculoskeletal:   Gait is normal.    C-Spine   There is no paraspinal tenderness. Paraspinal muscle tone is normal.  ROM with flexion, extension, rotation, and lateral bending is intact.  Strength is equal and adequate bilaterally in the flexors and extensors of the bilateral upper extremities.   Reflexes are 2/4 at the biceps, triceps, and brachioradialis bilaterally.   Hoffman's is negative bilaterally.    L-Spine   There is no paraspinal tenderness. Paraspinal muscle tone is normal.  Facet loading is negative.  There is no tenderness or radiating pain with palpation over the SI joints, piriformis, or greater trochanteric bursae bilaterally.  FABER is negative.  ROM with flexion, extension, rotation, and lateral bending is intact.  Strength is equal and adequate bilaterally in the flexors and extensors of the bilateral lower extremities.   Reflexes are 2/4 at the patella and achilles bilaterally.   SLR is negative bilaterally.      MRI C-Spine Results:    Results for orders placed during the hospital encounter of 04/06/21    MRI C-SPINE WO CONTRAST    Narrative  EXAM: MRI C-SPINE    HISTORY:    Neck/back pain radiating down the left upper extremity resulting in weakness and numbness. Approximately [redacted] weeks pregnant.    Technique: Multiple sagittal and axial MR sequences were obtained of the cervical spine.    Comparison: None    FINDINGS:    Slight reversal of the cervical lordosis centered at C5-C6. The vertebral body heights are maintained. Diffuse marrow T1 hypointensity. The cervical cord is normal in signal.    At C5-C6: Eccentric right-sided posterior disc osteophyte complex and ligamentum flavum thickening results in moderate central spinal stenosis.    At C6-C7: Left posterior disc osteophyte complex and disc herniation and ligamentum flavum thickening resulting in severe central spinal stenosis with cord compression, although, no  obvious cord edema.    Degenerative disc disease and uncovertebral arthrosis results in multilevel neural foraminal stenosis, most pronounced and of at least moderate degree on the left at C6-C7 and at least mild degree on the right at C5-C6 and C6-C7, although, evaluation of the foramina is limited due to patient motion on axial imaging. Tiny perineural root sleeve cysts cysts on the left at C5-6 and C6-C7.    The paraspinous soft tissues are unremarkable.    Impression  1.  Left posterior disc osteophyte complex and disc herniation at C6-C7 with severe central spinal stenosis and cord deformation, though, no obvious cord edema.  2.  Moderate degenerative central spinal stenosis (eccentric to the right at C5-C6).  3.  Multilevel degenerative neural foraminal stenosis, most pronounced of at least moderate degree on the left at C6-C7, although, evaluation of the foramina is limited due to patient motion on axial imaging.  4.  T1 hypointense marrow signal which may be physiologic, though, marrow hyperplasia could appear similar.      Approved by Gwinda Passe, MD on 04/06/2021 12:59 PM    By my electronic signature, I attest that I have personally reviewed the images for this examination and formulated the interpretations and opinions expressed in this report      Finalized by Desma Maxim, M.D. on 04/06/2021 1:47 PM. Dictated by Gwinda Passe, MD on 04/06/2021 12:32 PM.          Last Cr and LFT's:  Creatinine   Date Value Ref Range Status   06/28/2020 0.70 0.4 - 1.00 MG/DL Final     AST (SGOT)   Date Value Ref Range Status   06/28/2020 12 7 - 40 U/L Final     ALT (SGPT)   Date Value Ref Range Status   06/28/2020 13 7 - 56 U/L Final     Alk Phosphatase   Date Value Ref Range Status   06/28/2020 63 25 - 110 U/L Final     Total Bilirubin   Date Value Ref Range Status   06/28/2020 0.3 0.3 - 1.2 MG/DL Final          Assessment:    Karen Barr is a 35 y.o. female who  has a past medical history of Blood in stool and Depression. who presents for evaluation of pain.    The pain complaints are most likely due to:    1. Muscle spasm     2. Cervical radiculopathy  [facility]B SPINE INJECT INTERLAM CRV/THRC   3. DDD (degenerative disc disease), cervical     4. Myalgia, other site         Patient has had an adequate trial of > 2 weeks of rest, exercise, multimodal treatment, and the passage of time without improvement of symptoms. The pain has significant impact on the daily quality of life. Patient is a very complex case given her pregnancy. Patient and significant other have elected to proceed with CESI at C7-T1 after extensive discussion including risks of procedure including radiation. Patient inquired about out of pocket cost for procedure, which she will discuss with financial counselor. If we proceed with insurance approval, measures to minimize radiation will be taken including draping patient in lead with the exception of the cervical spine, use minimal low-dose radiation.    Plan:  1. Will schedule for CESI C7-T1 for cervical radiculopathy at first available appointment.   2. Continue current regimen. We discussed various pharmacologic options, but this is complicated by her pregnant  status. Medication management deferred to OB, due to patient's pregnancy..  3. No new imaging at this time. MRI C-spine images reviewed in clinic. DDD noted C5-7, but most pronounced with SS at C6-7.   4. Continue lifestyle modifications.    5. We could consider TPI for muscle spasm/myalgia component, particularly across the upper trapezius.   6. Follow up after procedure.     Risks/benefits of all pharmacologic and interventional treatments discussed and questions answered.     Annye Rusk, MD  Interventional Pain Medicine Fellow, PGY-5  Available on Voalte        Thank you for this kind referral for consultation. Please feel free to contact me with any questions or concerns.          ATTESTATION    I personally performed the key portions of the E/M visit, discussed case with resident and concur with resident documentation of history, physical exam, assessment, and treatment plan unless otherwise noted.    Staff name:  Evelina Bucy, MD Date:  04/11/2021

## 2021-04-11 NOTE — Procedures
Attending Surgeon: Evelina Bucy, MD    Anesthesia: Local    Pre-Procedure Diagnosis:   1. Cervical radiculopathy    2. DDD (degenerative disc disease), cervical        Post-Procedure Diagnosis:   1. Cervical radiculopathy    2. DDD (degenerative disc disease), cervical        Pain Score: Ten    ESI CRV/THRC  Procedure: epidural - interlaminar    Laterality: n/a   on 04/11/2021 4:00 PM  Location: cervical ESI with imaging -  C7-T1      Consent:   Consent obtained: written  Consent given by: patient  Risks discussed: allergic reaction, bleeding, bruising, infection, never damage, no change or worsening in pain, pneumo thorax, reaction to medication, seizure, swelling and weakness  Alternatives discussed: alternative treatment, delayed treatment and no treatment  Discussed with patient the purpose of the treatment/procedure, other ways of treating my condition, including no treatment/ procedure and the risks and benefits of the alternatives. Patient has decided to proceed with treatment/procedure.        Universal Protocol:  Relevant documents: relevant documents present and verified  Site marked: the operative site was marked  Patient identity confirmed: Patient identify confirmed verbally with patient.        Time out: Immediately prior to procedure a time out was called to verify the correct patient, procedure, equipment, support staff and site/side marked as required      Procedures Details:   Indications: pain   Prep: chlorhexidine  Patient position: prone  Estimated Blood Loss: minimal  Specimens: none  Number of Joints: 1  Approach: midline  Guidance: fluoroscopy  Contrast: Procedure confirmed with contrast under live fluoroscopy.  Needle and Epidural Catheter: tuohy  Needle size: 18 G  Injection procedure: Incremental injection and Negative aspiration for blood  Amount Injected:   C7-T1: 4mL  Patient tolerance: Patient tolerated the procedure well with no immediate complications. Pressure was applied, and hemostasis was accomplished.  Outcome: Pain unchanged  Comments:   CERVICAL INTERLAMINAR EPIDURAL STEROID INJECTION    PROCEDURE:  1) C7-T1 interlaminar epidural steroid injection  2) Fluoroscopic needle guidance    REASON FOR PROCEDURE: Cervical radiculopathy, DDD (cervical)    ATTENDING PHYSICIAN: Evelina Bucy, MD    MEDICATIONS INJECTED: 2 mL of triamciniolone (80 mg) and 2 mL of sterile, preservative-free normal saline    LOCAL ANESTHETIC INJECTED: 1 mL of 1% lidocaine    SEDATION MEDICATIONS: None    ESTIMATED BLOOD LOSS: None    SPECIMENS REMOVED: None    COMPLICATIONS: None    TECHNIQUE: Time-out was taken to identify the correct patient, procedure and side prior to starting the procedure. With the patient lying in a prone position with the neck in a slightly  flexed position, the area was prepped and draped in sterile fashion using DuraPrep and a fenestrated drape. The area was determined under fluoroscopic guidance. A 27-gauge, 1.25-inch  needle was used to anesthetize the needle entry site and subcutaneous tissues.     The 18-gauge, 3.5-inch Tuohy needle was advanced through the ligamentum flavum using loss of resistance technique. Once the tip of the needle was thought to be in the desired position, contrast was injected to confirm only epidural spread and no vascular runoff via A-P and lateral  views. The injectate was then injected slowly with intermittent negative aspiration.    Of note, because patient is pregnant, extensive risks/benefits discussion was undertaken and she wished to proceed. She was wrapped around her  abdomen/gravid area with lead.     The fluoroscope was set to low dose and pulsed. Minimal fluoroscopy was used, perhaps 3 pictures, to complete the procedure.     The procedure was completed without complications and was tolerated well. The patient was monitored after the procedure. The patient (or responsible party) was given post-procedure and  discharge instructions to follow at home. The patient was discharged in stable condition.    Evelina Bucy, MD, MBA          Administrations This Visit     iohexoL (OMNIPAQUE-240) 240 mg/mL injection 20 mL     Admin Date  04/11/2021 Action  Given Dose  20 mL Route  Epidural Administered By  Evelina Bucy, MD          triamcinolone acetonide (KENALOG-40) injection 80 mg     Admin Date  04/11/2021 Action  Given Dose  80 mg Route  Epidural Administered By  Evelina Bucy, MD              Estimated blood loss: none or minimal  Specimens: none  Patient tolerated the procedure well with no immediate complications. Pressure was applied, and hemostasis was accomplished.

## 2021-04-12 ENCOUNTER — Emergency Department: Admit: 2021-04-12 | Discharge: 2021-04-12 | Payer: No Typology Code available for payment source

## 2021-04-12 ENCOUNTER — Observation Stay: Admit: 2021-04-12 | Discharge: 2021-04-12 | Payer: No Typology Code available for payment source

## 2021-04-12 ENCOUNTER — Encounter: Admit: 2021-04-12 | Discharge: 2021-04-12 | Payer: No Typology Code available for payment source

## 2021-04-12 DIAGNOSIS — K921 Melena: Secondary | ICD-10-CM

## 2021-04-12 DIAGNOSIS — Z3A11 11 weeks gestation of pregnancy: Secondary | ICD-10-CM

## 2021-04-12 DIAGNOSIS — M4802 Spinal stenosis, cervical region: Secondary | ICD-10-CM

## 2021-04-12 DIAGNOSIS — M5030 Other cervical disc degeneration, unspecified cervical region: Secondary | ICD-10-CM

## 2021-04-12 DIAGNOSIS — F32A Depression: Secondary | ICD-10-CM

## 2021-04-12 DIAGNOSIS — M79601 Pain in right arm: Secondary | ICD-10-CM

## 2021-04-12 MED ORDER — FENTANYL CITRATE (PF) 50 MCG/ML IJ SOLN
50 ug | Freq: Once | INTRAVENOUS | 0 refills | Status: CP
Start: 2021-04-12 — End: ?
  Administered 2021-04-12: 14:00:00 50 ug via INTRAVENOUS

## 2021-04-12 MED ORDER — LIDOCAINE 5 % TP PTMD
1 | Freq: Once | TOPICAL | 0 refills | Status: CP
Start: 2021-04-12 — End: ?
  Administered 2021-04-12: 14:00:00 1 via TOPICAL

## 2021-04-12 MED ORDER — MORPHINE 4 MG/ML IV SYRG
4 mg | Freq: Once | INTRAVENOUS | 0 refills | Status: CP
Start: 2021-04-12 — End: ?
  Administered 2021-04-13: 04:00:00 4 mg via INTRAVENOUS

## 2021-04-12 MED ORDER — MORPHINE 4 MG/ML IV SYRG
6 mg | Freq: Once | INTRAVENOUS | 0 refills | Status: CP
Start: 2021-04-12 — End: ?
  Administered 2021-04-12: 18:00:00 6 mg via INTRAVENOUS

## 2021-04-12 MED ORDER — MESALAMINE 4 GRAM/60 ML RE ENEM
4 g | Freq: Every evening | RECTAL | 0 refills | Status: AC
Start: 2021-04-12 — End: ?

## 2021-04-12 MED ORDER — MORPHINE 4 MG/ML IV SYRG
4 mg | Freq: Once | INTRAVENOUS | 0 refills | Status: CP
Start: 2021-04-12 — End: ?
  Administered 2021-04-12: 15:00:00 4 mg via INTRAVENOUS

## 2021-04-12 MED ORDER — SERTRALINE 25 MG PO TAB
25 mg | Freq: Every day | ORAL | 0 refills | Status: AC
Start: 2021-04-12 — End: ?
  Administered 2021-04-13 – 2021-04-16 (×5): 25 mg via ORAL

## 2021-04-12 MED ORDER — AMITRIPTYLINE(#)/GABAPENTIN/EMU OIL 4/4/10%IN HRT TOPICAL CRM
TOPICAL | 0 refills | Status: AC
Start: 2021-04-12 — End: ?
  Administered 2021-04-13 (×2): via TOPICAL

## 2021-04-12 MED ORDER — ACETAMINOPHEN 500 MG PO TAB
1000 mg | ORAL | 0 refills | Status: AC | PRN
Start: 2021-04-12 — End: ?
  Administered 2021-04-13 – 2021-04-15 (×4): 1000 mg via ORAL

## 2021-04-12 MED ORDER — MORPHINE 4 MG/ML IV SYRG
4 mg | Freq: Once | INTRAVENOUS | 0 refills | Status: CP
Start: 2021-04-12 — End: ?
  Administered 2021-04-12: 20:00:00 4 mg via INTRAVENOUS

## 2021-04-12 MED ORDER — OXYCODONE 5 MG PO TAB
5-10 mg | ORAL | 0 refills | Status: AC | PRN
Start: 2021-04-12 — End: ?
  Administered 2021-04-12: 23:00:00 5 mg via ORAL
  Administered 2021-04-13 – 2021-04-16 (×14): 10 mg via ORAL
  Administered 2021-04-16: 15:00:00 5 mg via ORAL
  Administered 2021-04-16: 11:00:00 10 mg via ORAL
  Administered 2021-04-16: 18:00:00 5 mg via ORAL

## 2021-04-12 MED ORDER — ENOXAPARIN 40 MG/0.4 ML SC SYRG
40 mg | Freq: Every day | SUBCUTANEOUS | 0 refills | Status: AC
Start: 2021-04-12 — End: ?
  Administered 2021-04-13 – 2021-04-16 (×4): 40 mg via SUBCUTANEOUS

## 2021-04-12 MED ORDER — MELATONIN 5 MG PO TAB
5 mg | Freq: Every evening | ORAL | 0 refills | Status: AC | PRN
Start: 2021-04-12 — End: ?
  Administered 2021-04-14 – 2021-04-16 (×3): 5 mg via ORAL

## 2021-04-12 MED ORDER — ACETAMINOPHEN 325 MG PO TAB
650 mg | Freq: Once | ORAL | 0 refills | Status: CP
Start: 2021-04-12 — End: ?
  Administered 2021-04-12: 20:00:00 650 mg via ORAL

## 2021-04-12 MED ORDER — MORPHINE 4 MG/ML IV SYRG
4 mg | INTRAVENOUS | 0 refills | Status: AC | PRN
Start: 2021-04-12 — End: ?
  Administered 2021-04-12 – 2021-04-13 (×2): 4 mg via INTRAVENOUS

## 2021-04-12 MED ORDER — LACTATED RINGERS IV SOLP
1000 mL | INTRAVENOUS | 0 refills | Status: CP
Start: 2021-04-12 — End: ?
  Administered 2021-04-12: 20:00:00 1000 mL via INTRAVENOUS

## 2021-04-12 MED ORDER — CYCLOBENZAPRINE 10 MG PO TAB
10 mg | Freq: Once | ORAL | 0 refills | Status: CP
Start: 2021-04-12 — End: ?
  Administered 2021-04-12: 20:00:00 10 mg via ORAL

## 2021-04-12 MED ORDER — LIDOCAINE 5 % TP PTMD
1 | Freq: Once | TOPICAL | 0 refills | Status: DC
Start: 2021-04-12 — End: 2021-04-13

## 2021-04-12 MED ORDER — MORPHINE 4 MG/ML IV SYRG
6 mg | Freq: Once | INTRAVENOUS | 0 refills | Status: CP
Start: 2021-04-12 — End: ?
  Administered 2021-04-12: 17:00:00 6 mg via INTRAVENOUS

## 2021-04-12 MED ORDER — METHOCARBAMOL 750 MG PO TAB
750 mg | Freq: Two times a day (BID) | ORAL | 0 refills | Status: AC
Start: 2021-04-12 — End: ?
  Administered 2021-04-13 – 2021-04-16 (×8): 750 mg via ORAL

## 2021-04-12 MED ORDER — ACETAMINOPHEN 325 MG PO TAB
650 mg | Freq: Once | ORAL | 0 refills | Status: DC
Start: 2021-04-12 — End: 2021-04-12

## 2021-04-12 MED ORDER — LIDOCAINE 5 % TP PTMD
1 | Freq: Every day | TOPICAL | 0 refills | Status: AC
Start: 2021-04-12 — End: ?
  Administered 2021-04-13 – 2021-04-15 (×3): 1 via TOPICAL

## 2021-04-12 MED ORDER — PRENATAL VIT-IRON FUM-FOLIC AC 28 MG IRON- 800 MCG PO TAB
1 | Freq: Every day | ORAL | 0 refills | Status: AC
Start: 2021-04-12 — End: ?
  Administered 2021-04-13 – 2021-04-16 (×5): 1 via ORAL

## 2021-04-12 NOTE — Telephone Encounter
Patient phoned regarding severe pain in neck and upper extremities.  Same pain as yesterday, but more pronounced.  Sharp in the neck, with radiation down the arm.  Denies new injury, denies bowel/bladder dysfunction.  Able to walk without problem.  Has tried heat/ice.  CESI noted yesterday per Dr. Lourdes Sledge.  Pregnancy status noted.  Clinic note reviewed.  Discussed that neck CESI can take a few days to work, to reduce inflammation around nerves.  Common for patients to feel lack of relief until the medication takes effect.  No new orders given due to pregnancy status, recommended touching base with OBGYN to see if they have pain medication preferences for her pregnancy.  Also stated that if conservative measures fail and if injection fails to improve pain over the next week, she can always present to the ED for further evaluation and management. Patient agrees with plan.    Chelsea Primus, MD

## 2021-04-12 NOTE — ED Notes
Report given to HC7

## 2021-04-12 NOTE — ED Notes
Report to Christine RN for continuity of care.

## 2021-04-12 NOTE — H&P (View-Only)
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 female admitted for severe cervical neck pain after epidural injection 8/26.    PMH/Comorbidities: Depression, chronic ulcerative proctitis with rectal bleeding, cervical spinal stenosis, degenerative disc disease of cervical spine, currently [redacted] weeks pregnant    Allergies: Patient has no known allergies.    Spinal stenosis of C-spine  Degenerative disc disease of C-spine  Intractable neck pain  -Patient went to spine center on 8/26 to get a cervical epidural spinal injection.  She was having numbness, tingling, and pressure before the injection which acutely worsened after the injection.  -She attempted conservative management at home with Tylenol, Flexeril, heat but was unable to tolerate the pain.  - 10 out of 10 pain immediately after the injection but now currently 6 out of 10 pain after receiving IV pain meds in emergency department.  - No worrisome features or red flag back pain symptoms.  Neurologically intact except for decreased sensation of LUE.  -MRI C Spine: No significant change in degenerative changes since August 2022, no epidural fluid collection.  Disc herniation at C6-C7 with severe spinal canal stenosis.  Additional spinal stenosis at C5-C6.  -MRI T-spine: No epidural fluid collection or cord signal abnormality.  PLAN  > Anesthesia pain consulted  > Pain control: Tylenol 1000 mg every 6 as needed, lidocaine patch as needed, heating pad as needed, amitriptyline/gabapentin/emu oil topical as needed, Robaxin 750 mg twice daily, oxycodone 5- 10 mg p.o. every 4 hours as needed, morphine 4 mg IV every 4 as needed for breakthrough pain.  > Can consider neuropathic agent such as gabapentin or Lyrica if deemed safe for pregnancy.      [redacted] weeks gestation of pregnancy -prenatal vitamins daily, all medications administered in hospital should be deemed safe for pregnancy.  Continue to monitor.    Chronic ulcerative proctitis with rectal bleeding -mesalamine 1000 mg rectal suppository nightly    Depression -continue sertraline 25 mg daily    FEN:   > Diet: Regular   > IVF: none   > Monitor and replace electrolytes as needed    ACCESS:  Lines: PIV  Urinary Catether: none    Prophylaxis Review:  DVT prophylaxis    Lovenox  GI Prophylaxis:  Not indicated   PT/OT: none  Wounds: none    Code Status:  Full Code    Disposition: Admit to Family Medicine for management of severe intractable cervical neck pain after an epidural spinal injection.    Olivia Canter, MD  [facility]  Dept. Of Family and Community Medicine, PGY-2  Family Medicine Inpatient Pager: [phone]  __________________________________________________________________________________  Primary Care Physician: [name]  Verified    Chief Complaint:  Neck pain    History of Present Illness: Karen Barr is a 35 y.o. female with PMH/comorbidities including Depression, chronic ulcerative proctitis with rectal bleeding, cervical spinal stenosis,  degenerative disc disease of cervical spine, currently [redacted] weeks pregnant.    Patient is a 35 year old female who is currently [redacted] weeks pregnant who presents the emergency department with severe cervical neck pain.  She states she received an epidural spinal injection on 8/26 with a spine center.  Since that time she had increasing pain.  Before the incident she was having pressure, numbness, tingling in left upper extremity.  Pain was relatively controlled 5 or 6 out of 10.  She then went to get the procedure done and immediately after the procedure started having severe 10 out of 10 pain in her cervical spine.  Since that time she has had continued pain and says that she feels like she is having spasms down her arm but notes that her muscles are not twitching.  She has numbness at baseline but says the numbness is progressed and started to travel more proximally up her left upper extremity.  Her tingling is following the same pattern and getting worse.  She has been doing conservative measures at home including taking a Flexeril, using heat, and resting.  Her pain is uncontrolled despite interventions done in the emergency department.  She says that the fentanyl she received in the emergency department did not help.  The morphine she received in the emergency department did help.  She says she is not having any symptoms in her right upper extremity or bilateral lower extremities.  After IV opioids her pain is currently a 6 out of 10.  She denies any worrisome features such as fever, chills, night sweats, loss of bowel or bladder function, lower extremity weakness.  She is being admitted for pain control.    Current (ED) laboratory/diagnostic evaluation  ? MRI C Spine: No significant change in degenerative changes since August 2022, no epidural fluid collection.  Disc herniation at C6-C7 with severe spinal canal stenosis.  Additional spinal stenosis at C5-C6.  ? MRI T-spine: No epidural fluid collection or cord signal abnormality.    ED therapeutic course  Tylenol 650 mg, Flexeril 10 mg, fentanyl 50 mcg x 1, LR 1 L bolus, lidocaine patch, morphine 4 mg IV x2, morphine 6 mg IV x2      Medical History:   Diagnosis Date   ? Blood in stool    ? Depression      Surgical History:   Procedure Laterality Date   ? Katrinka Blazing and nephew fast fix 360* ARTHROSCOPY LEFTKNEE WITH PARTIAL LATERAL  MENISCECTOMY Left 03/19/2020    Performed by Talmadge Chad, MD at [facility] OR   ? COLONOSCOPY DIAGNOSTIC WITH SPECIMEN COLLECTION BY BRUSHING/ WASHING - FLEXIBLE N/A 07/08/2020    Performed by Buckles, Vinnie Level, MD at [facility] OR   ? HX SHOULDER ARTHROSCOPY     ? HX TONSILLECTOMY       Family History   Problem Relation Age of Onset   ? Diabetes Mother    ? High Cholesterol Mother    ? Hypertension Mother    ? Miscarriages Mother    ? Diabetes Sister    ? Cancer Maternal Grandmother    ? High Cholesterol Maternal Grandmother      Social History     Tobacco Use   ? Smoking status: Never Smoker   ? Smokeless tobacco: Never Used   Substance and Sexual Activity   ? Alcohol use: Not Currently   ? Drug use: No   ? Sexual activity: Yes     Partners: Female  Immunizations (includes history and patient reported):   Immunization History   Administered Date(s) Administered   ? Anthrax Vaccine 03/30/2007, 06/01/2007, 06/27/2007, 11/30/2007, 07/23/2009, 01/24/2010   ? COVID-19 (MODERNA BOOSTER), mRNA vacc, 50 mcg/0.25 mL (PF) 08/22/2020   ? COVID-19 (MODERNA), mRNA vacc, 100 mcg/0.5 mL (PF) 09/08/2019, 10/06/2019   ? Flu Vaccine =>6 Months Quadrivalent PF 07/30/2017, 09/18/2019, 08/22/2020   ? HPV Vaccine 9 Valent IM (Gardasil 9) 09/18/2019, 01/23/2020   ? Hepatitis A vaccine Adult IM 12/16/2005, 07/30/2006   ? Hepatitis B Vaccine Adult 3 Dose IM 12/17/2006, 03/30/2007, 04/01/2007, 05/15/2008   ? Hepatitis B Vaccine Ped/Adol 3 Dose IM 06/21/1998, 08/23/1998, 10/22/1998   ? IPV 12/16/2005   ? Meningococcal Conjug Vaccine IM (MenACWY-D)(Menactra) 12/16/2005   ? Tdap Vaccine 08/01/2010, 01/19/2012, 07/30/2017   ? Typhoid Vaccine Im 07/30/2006, 09/20/2009   ? Typhoid Vaccine, unspecified 08/23/2008           Allergies:  Patient has no known allergies.    Medications:    Current Facility-Administered Medications:   ?  acetaminophen (TYLENOL EXTRA STRENGTH) tablet 1,000 mg, 1,000 mg, Oral, Q6H PRN, Esaw Grandchild, MD  ?  lidocaine (LIDODERM) 5 % topical patch 1 patch, 1 patch, Topical, ONCE, Lo, Peter, MD  ?  methocarbamoL (ROBAXIN) tablet 750 mg, 750 mg, Oral, BID, Esaw Grandchild, MD  ?  morphine injection 4 mg, 4 mg, Intravenous, Q4H PRN, Esaw Grandchild, MD, 4 mg at 04/12/21 1734  ?  oxyCODONE (ROXICODONE) tablet 5-10 mg, 5-10 mg, Oral, Q4H PRN, Esaw Grandchild, MD, 5 mg at 04/12/21 1734  ?  vitamins, prenatal w/iron & folate tablet 1 tablet, 1 tablet, Oral, QDAY, Esaw Grandchild, MD    Current Outpatient Medications:   ?  acetaminophen (TYLENOL EXTRA STRENGTH) 500 mg tablet, Take 1,000 mg by mouth every 8 hours as needed for Pain. Max of 4,000 mg of acetaminophen in 24 hours., Disp: , Rfl:   ?  cholecalciferol (vitamin D3) (VITAMIN D3 PO), Take  by mouth daily., Disp: , Rfl:   ?  ergocalciferol (vitamin D2) (VITAMIN D PO), Take  by mouth., Disp: , Rfl:   ?  fluticasone propionate (FLONASE) 50 mcg/actuation nasal spray, suspension, Apply 2 sprays to each nostril as directed daily. Shake bottle gently before using., Disp: , Rfl:   ?  menthol/camphor (BIOFREEZE TP), Apply  topically to affected area as Needed., Disp: , Rfl:   ?  mesalamine (CANASA) 1,000 mg rectal suppository, Insert or Apply one suppository to rectal area as directed at bedtime daily., Disp: 30 suppository, Rfl: 5  ?  methylPREDNIsolone (MEDROL (PAK)) 4 mg tablet, Take medication as directed on package for 6 days. Take with food., Disp: 21 tablet, Rfl: 0  ?  sertraline (ZOLOFT) 25 mg tablet, Take one tablet by mouth daily., Disp: 90 tablet, Rfl: 3  ?  vitamins, prenatal w/iron & folate 65/1 mg tab, Take 1 tablet by mouth daily., Disp: , Rfl:     Review of Systems:  A 10 systems review of systems was negative except as noted in HPI above    Physical Exam:  Vital Signs: Last Filed In 24 Hours Vital Signs: 24 Hour Range   BP: 117/83 (08/27 1730)  Temp: 36.9 ?C (98.5 ?F) (08/27 1610)  Pulse: 77 (08/27 0817)  SpO2: 98 % (08/27 1730)  O2 Device: None (Room air) (08/27 0817)  SpO2 Pulse: 62 (08/27 1730)  Height: 175.3 cm (5' 9.02) (08/27 0914) BP: (117-176)/(77-111)  Temp:  [36.9 ?C (98.5 ?F)] Pulse:  [77]   SpO2:  [97 %-100 %]   O2 Device: None (Room air)   Intensity Pain Scale (Self Report): 7 (04/12/21 1037)        Physical Exam  Vitals and nursing note reviewed.   Constitutional:       Comments: She appears very uncomfortable and in pain   HENT:      Head: Normocephalic.   Eyes:      General: No scleral icterus.     Extraocular Movements: Extraocular movements intact.      Conjunctiva/sclera: Conjunctivae normal.   Neck:     Cardiovascular:      Rate and Rhythm: Normal rate and regular rhythm.      Pulses: Normal pulses.      Heart sounds: Normal heart sounds. No murmur heard.  Pulmonary:      Effort: Pulmonary effort is normal. No respiratory distress.      Breath sounds: Normal breath sounds. No wheezing.   Abdominal:      General: Abdomen is flat. Bowel sounds are normal. There is no distension.      Palpations: Abdomen is soft.      Tenderness: There is no abdominal tenderness.   Musculoskeletal:      Cervical back: Pain with movement present. Decreased range of motion.      Comments: Sensation: loss of sensation of left upper extremity to mid humerus. RUE normal sensation   Equal sensation bilaterally in lower extremities  Motor: grossly equal muscle strength in bilateral lower extremities. Grip strength, wrist dorsiflexion, finger ab/adduction normal in RUE and LUE   Neurological:      Mental Status: She is alert and oriented to person, place, and time.      Cranial Nerves: No cranial nerve deficit.   Psychiatric:         Mood and Affect: Mood normal.         Behavior: Behavior normal.         Thought Content: Thought content normal.         Judgment: Judgment normal.           Lab/Radiology/Other Diagnostic Tests:  24-hour labs:  No results found for this visit on 04/12/21 (from the past 24 hour(s)).         MRI C-SPINE WO CONTRAST   Final Result         C-spine:      1.  No significant interval change in degenerative changes since April 06, 2021. No epidural fluid collection identified. 2.  Left posterior disc osteophyte complex and disc herniation at C6-C7 with severe spinal canal stenosis. Additional moderate spinal canal stenosis at C5-C6.   3.  Multilevel degenerative foraminal stenosis, as detailed above.      T-spine:      1.  No epidural fluid collection or cord signal abnormality.      By my electronic signature, I attest that I have personally reviewed the images for this examination and formulated the interpretations and opinions expressed in this report          Finalized by Joseph Berkshire, M.D. on 04/12/2021 3:09 PM. Dictated by Vangie Bicker, MD on 04/12/2021 1:46 PM.         MRI T-SPINE WO CONTRAST   Final Result         C-spine:      1.  No significant interval change in degenerative changes since April 06, 2021. No epidural fluid  collection identified.   2.  Left posterior disc osteophyte complex and disc herniation at C6-C7 with severe spinal canal stenosis. Additional moderate spinal canal stenosis at C5-C6.   3.  Multilevel degenerative foraminal stenosis, as detailed above.      T-spine:      1.  No epidural fluid collection or cord signal abnormality.      By my electronic signature, I attest that I have personally reviewed the images for this examination and formulated the interpretations and opinions expressed in this report          Finalized by Joseph Berkshire, M.D. on 04/12/2021 3:09 PM. Dictated by Vangie Bicker, MD on 04/12/2021 1:46 PM.             Microbiology  Resulted Micro Last 72 Hrs    No results found           Pathology      Olivia Canter, MD  [facility]  Dept. Of Family and Community Medicine, PGY-2  Family Medicine Inpatient Pager: [phone]

## 2021-04-12 NOTE — Consults
Interventional Pain Consult Note      Admission Date: 04/12/2021                                                LOS: 0 days    Reason for Consult:  Pain control    Consult type: Opinion with orders    Assessment/Plan    Karen Barr is a 34/F G1P0 at [redacted]w[redacted]d with a hx of cervical spinal stenosis s/p C7-T1 cervical epidural steroid injection on 04/11/21 who presents with worsening neck pain that radiates to the arms. MRI without acute pathologic process such as epidural hematoma or abscess.    *Plan:   > No immediate plan for further interventional procedure at this time as she received a cervical epidural steroid injection on 04/11/21 with Dr Joan [facility] clinic   > Agree with multimodal analgesia as outlined by primary team, family medicine, and concern for pharmacologic effects of fetus. Pain medicine to be managed by [facility] medicine as she receives her OB care through their department.   > Would recommend follow-up as an outpatient with chronic pain    Renae Gloss, DO  PGY-4 Anesthesiology  Available on voalte    Plan of care discussed with chronic pain fellow on-call     ______________________________________________________________________    History of Present Illness: Karen Barr is a 35 y.o. female with hx as noted above. Pain is noted to be in the lower posterior neck with radiation into L > R arm that feels sharp. She notes it feels worse compared to pre epidural steroid injx on 8/26. ROS otherwise unremarkable.   Medical History:   Diagnosis Date   ? Blood in stool    ? Depression      Surgical History:   Procedure Laterality Date   ? Katrinka Blazing and nephew fast fix 360* ARTHROSCOPY LEFTKNEE WITH PARTIAL LATERAL  MENISCECTOMY Left 03/19/2020    Performed by Talmadge Chad, MD at [facility] OR   ? COLONOSCOPY DIAGNOSTIC WITH SPECIMEN COLLECTION BY BRUSHING/ WASHING - FLEXIBLE N/A 07/08/2020    Performed by Buckles, Vinnie Level, MD at [facility] OR   ? HX SHOULDER ARTHROSCOPY     ? HX TONSILLECTOMY       Social History     Tobacco Use   ? Smoking status: Never Smoker   ? Smokeless tobacco: Never Used   Substance and Sexual Activity   ? Alcohol use: Not Currently   ? Drug use: No   ? Sexual activity: Yes     Partners: Female           Family history reviewed; non-contributory  Allergies:  Patient has no known allergies.    Scheduled Meds:lidocaine (LIDODERM) 5 % topical patch 1 patch, 1 patch, Topical, ONCE    Continuous Infusions:  PRN and Respiratory Meds:    Review of Systems:  All other systems reviewed and are negative.  Vital Signs:  Last Filed in 24 hours Vital Signs:  24 hour Range    BP: 126/84 (08/27 1600)  Temp: 36.9 ?C (98.5 ?F) (08/27 5784)  Pulse: 77 (08/27 0817)  SpO2: 99 % (08/27 1600)  O2 Device: None (Room air) (08/27 0817)  SpO2 Pulse: 71 (08/27 1600)  Height: 175.3 cm (5' 9.02) (08/27 0914) BP: (118-176)/(78-111)   Temp:  [36.9 ?C (98.5 ?F)]   Pulse:  [77]  SpO2:  [97 %-100 %]   O2 Device: None (Room air)     Physical Exam:  General:  Mild distress  Neck:    Deferred due to pain   Musculoskeletal:   Sensation intact, 5/5 gross strength of b/l UEs    Lab/Radiology/Other Diagnostic Tests:  24-hour labs:  No results found for this visit on 04/12/21 (from the past 24 hour(s)).  Pertinent radiology reviewed.    Renae Gloss, DO  Pager

## 2021-04-12 NOTE — ED Provider Notes
Karen Barr is a 35 y.o. female.    Chief Complaint:  Chief Complaint   Patient presents with   ? 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  The patient received an epidural spinal injection yesterday. One hour after her injection she began having muscle spasms and 10/10 sharp, shooting pain that extends from the left side of her neck neck to her left hand.  She also reports worsening numbness and tingling, extending from the her fingertips to her elbow. She also feels that she is weaker in her shoulder, elbow, and hand. Her pain is worse with L lateral neck flexion and all shoulder movement. The patient also notes that prior to her injection she heard a pop in her neck while in the shower which exacerbated her pain, but her pain has particularly been worse since after the injection. She's been taking Tylenol 1000mg  q6h and using icy hot for her pain without any relief. She denies fevers, chills, headache, photophobia.           Review of Systems:  Review of Systems   Constitutional: Negative for chills and fever.   HENT: Negative for sore throat.    Eyes: Negative for photophobia and visual disturbance.   Respiratory: Positive for shortness of breath (2/2 to pain).    Cardiovascular: Negative for chest pain.   Gastrointestinal: Negative for abdominal pain.   Genitourinary: Negative for dysuria.   Musculoskeletal: Positive for back pain and neck pain. Negative for neck stiffness.   Skin: Negative for rash.   Neurological: Positive for weakness and numbness. Negative for headaches.       Allergies:  Patient has no known allergies.    Past Medical History:  Medical History: Diagnosis Date   ? Blood in stool    ? Depression        Past Surgical History:  Surgical History:   Procedure Laterality Date   ? Katrinka Blazing and nephew fast fix 360* ARTHROSCOPY LEFTKNEE WITH PARTIAL LATERAL  MENISCECTOMY Left 03/19/2020    Performed by Talmadge Chad, MD at [facility] OR   ? COLONOSCOPY DIAGNOSTIC WITH SPECIMEN COLLECTION BY BRUSHING/ WASHING - FLEXIBLE N/A 07/08/2020    Performed by Buckles, Vinnie Level, MD at [facility] OR   ? HX SHOULDER ARTHROSCOPY     ? HX TONSILLECTOMY         Pertinent medical/surgical history reviewed    Social History:  Social History     Tobacco Use   ? Smoking status: Never Smoker   ? Smokeless tobacco: Never Used   Substance Use Topics   ? Alcohol use: Not Currently   ? Drug use: No     Social History     Substance and Sexual Activity   Drug Use No             Family History:  Family History   Problem Relation Age of Onset   ? Diabetes Mother    ? High Cholesterol Mother    ? Hypertension Mother    ? Miscarriages Mother    ? Diabetes Sister    ? Cancer Maternal Grandmother    ?  High Cholesterol Maternal Grandmother        Vitals:  ED Vitals    Date and Time T BP P RR SPO2P SPO2 User   04/12/21 1630 -- 131/86 -- -- 68 100 % CR   04/12/21 1600 -- 126/84 -- -- 71 99 % CR   04/12/21 1530 -- 126/90 -- -- 75 99 % CR   04/12/21 1500 -- 121/78 -- -- 69 99 % CR   04/12/21 1430 -- 120/78 -- -- 71 100 % CR   04/12/21 1402 -- 125/87 -- -- 72 100 % CR   04/12/21 1230 -- 176/102 -- -- 69 99 % CR   04/12/21 1200 -- 144/96 -- -- 68 100 % CR   04/12/21 1130 -- 156/105 -- -- 73 100 % CR   04/12/21 1103 -- -- -- -- 54 100 % CR   04/12/21 1102 -- 136/92 -- -- -- -- CR   04/12/21 1030 -- 118/86 -- -- 51 98 % VB   04/12/21 1001 -- -- -- -- 61 99 % VB   04/12/21 1000 -- 121/81 -- -- 70 -- VB   04/12/21 0930 -- 169/111 -- -- 57 99 % VB   04/12/21 0830 -- 142/106 -- -- 82 97 % VB   04/12/21 0817 36.9 ?C (98.5 ?F) 135/103 77 -- -- 100 % VB          Physical Exam:  Physical Exam  Vitals and nursing note reviewed.   Constitutional:       Appearance: Normal appearance. She is normal weight.      Comments: Uncomfortable appearing   HENT:      Head: Normocephalic and atraumatic.   Eyes:      Extraocular Movements: Extraocular movements intact.      Conjunctiva/sclera: Conjunctivae normal.   Cardiovascular:      Rate and Rhythm: Normal rate and regular rhythm.      Heart sounds: No murmur heard.    No friction rub. No gallop.   Pulmonary:      Breath sounds: Normal breath sounds. No wheezing, rhonchi or rales.      Comments: Shallow breath sounds  Abdominal:      General: Bowel sounds are normal. There is no distension.      Palpations: Abdomen is soft.      Tenderness: There is no abdominal tenderness.   Musculoskeletal:      Cervical back: Tenderness present. No edema, signs of trauma or rigidity. Pain with movement (Worse with L lateral neck flexion and L rotation ), spinous process tenderness (C6, C7 bony tenderness) and muscular tenderness (Diffuse paraspinal tenderness) present. Decreased range of motion (Decreased rotation bilaterally and L lateral flexion).   Skin:     General: Skin is warm and dry.      Comments: No ecchymosis or edema of the posterior neck   Neurological:      Mental Status: She is oriented to person, place, and time. Mental status is at baseline.      Sensory: Sensory deficit (Diffuse numbness/parasthesia in the L hand and arm) present.      Motor: Weakness (4/5 strength in the L UE compared to 5/5 in R UE) present.   Psychiatric:         Mood and Affect: Mood normal.         Behavior: Behavior normal.         Laboratory Results:  Labs Reviewed - No data to display  Radiology Interpretation:  MRI C-SPINE WO CONTRAST   Final Result         C-spine:      1.  No significant interval change in degenerative changes since April 06, 2021. No epidural fluid collection identified.   2.  Left posterior disc osteophyte complex and disc herniation at C6-C7 with severe spinal canal stenosis. Additional moderate spinal canal stenosis at C5-C6.   3.  Multilevel degenerative foraminal stenosis, as detailed above.      T-spine:      1.  No epidural fluid collection or cord signal abnormality.      By my electronic signature, I attest that I have personally reviewed the images for this examination and formulated the interpretations and opinions expressed in this report          Finalized by Joseph Berkshire, M.D. on 04/12/2021 3:09 PM. Dictated by Vangie Bicker, MD on 04/12/2021 1:46 PM.         MRI T-SPINE WO CONTRAST   Final Result         C-spine:      1.  No significant interval change in degenerative changes since April 06, 2021. No epidural fluid collection identified.   2.  Left posterior disc osteophyte complex and disc herniation at C6-C7 with severe spinal canal stenosis. Additional moderate spinal canal stenosis at C5-C6.   3.  Multilevel degenerative foraminal stenosis, as detailed above.      T-spine:      1.  No epidural fluid collection or cord signal abnormality.      By my electronic signature, I attest that I have personally reviewed the images for this examination and formulated the interpretations and opinions expressed in this report          Finalized by Joseph Berkshire, M.D. on 04/12/2021 3:09 PM. Dictated by Vangie Bicker, MD on 04/12/2021 1:46 PM.             EKG:  None    ED Course:  Patient is a 89Y F who comes to the emergency department with a chief complaint of neck and arm pain.  Vitals signs are reviewed and are within normal limits.   Physical exam as documented above.  IV is established.     Based on the patient's history and physical exam, differential diagnosis includes, but is not limited to: aggravation/worsening of known disc herniation, epidural hematoma, postoperative edema. Epidural spinal abscess considered but unlikely based on acute onset of symptoms and vital sign stability, patient afebrile.     ED Course: Patient given fentanyl , lidocaine patch for pain control. Although aggravation of known pathology seems more likely, cannot rule out epidural hematoma given recent intervention so will obtain MRI C and T spine to further evaluate. Upon re-evaluation, patient pain continues to be poorly controlled. Gave additional morphine 4mg  and morphine 6mg  x 2 and Tylenol 650mg . Patient's MRI of C and T spine with no significant changes from prior. No epidural fluid collections or worsening spinal cord stenosis, compression or edema. Patient requiring multiple additional medications for control of her pain including: flexeril 10mg , Tylenol 650mg , morphine 4mg  and lidocaine patch. Patient with minimal symptomatic improvement despite multi-modal treatment of pain. Reached out to Anesthesiology Pain Medicine to discuss case with patient who agreed to come evaluate the patient at bedside. Due to inability to adequately control patient's pain, patient requiring admission for further pain management.     Findings discussed with patient and the plan for admission. Patient understands  and agrees with plan. Patient reassessed with appropriate vital signs and symptom control for transfer. Patient verbalized understanding. Admission team was notified and patient was accepted to inpatient service. All questions were answered prior to transfer to inpatient service.              ED Scoring:                                Coding    Facility Administered Meds:  Medications   lidocaine (LIDODERM) 5 % topical patch 1 patch (0 patches Topical Planned Hold 04/12/21 1518)   vitamins, prenatal w/iron & folate tablet 1 tablet (has no administration in time range)   acetaminophen (TYLENOL EXTRA STRENGTH) tablet 1,000 mg (has no administration in time range)   methocarbamoL (ROBAXIN) tablet 750 mg (has no administration in time range)   oxyCODONE (ROXICODONE) tablet 5-10 mg (has no administration in time range)   morphine injection 4 mg (has no administration in time range)   fentaNYL citrate PF (SUBLIMAZE) injection 50 mcg (50 mcg Intravenous Given 04/12/21 0847)   lidocaine (LIDODERM) 5 % topical patch 1 patch (0 patches Topical Patch/Topical Removed 04/12/21 1515)   morphine injection 4 mg (4 mg Intravenous Given 04/12/21 0950)   morphine injection 6 mg (6 mg Intravenous Given 04/12/21 1152)   morphine injection 6 mg (6 mg Intravenous Given 04/12/21 1240)   lactated ringers infusion (1,000 mL Intravenous Given - New Bag 04/12/21 1512)   acetaminophen (TYLENOL) tablet 650 mg (650 mg Oral Given 04/12/21 1511)   morphine injection 4 mg (4 mg Intravenous Given 04/12/21 1513)   cyclobenzaprine (FLEXERIL) tablet 10 mg (10 mg Oral Given 04/12/21 1511)       Clinical Impression:  Clinical Impression   Spinal stenosis of cervical region   Bilateral arm pain   [redacted] weeks gestation of pregnancy       Disposition/Follow up  ED Disposition     ED Disposition   Admit           No follow-up provider specified.    Medications:  New Prescriptions    No medications on file       Procedure Notes:  Procedures      Attestation / Supervision:        Christain Sacramento, MS4      I personally performed the key portions of the E/M visit, discussed case with medical student and concur with medical student in documentation of history, physical exam, assessment, and treatment plan unless otherwise noted.    Donnita Falls, MD  Emergency Medicine, PGY-3

## 2021-04-13 MED ADMIN — HYDROMORPHONE (PF) 2 MG/ML IJ SYRG [163476]: 1 mg | INTRAVENOUS | @ 19:00:00 | NDC 00409131203

## 2021-04-13 MED ADMIN — HYDROMORPHONE (PF) 2 MG/ML IJ SYRG [163476]: 1 mg | INTRAVENOUS | @ 16:00:00 | NDC 00409131203

## 2021-04-13 MED ADMIN — HYDROMORPHONE (PF) 2 MG/ML IJ SYRG [163476]: 1 mg | INTRAVENOUS | @ 06:00:00 | Stop: 2021-04-13 | NDC 00409131203

## 2021-04-13 MED ADMIN — HYDROMORPHONE (PF) 2 MG/ML IJ SYRG [163476]: 1 mg | INTRAVENOUS | @ 23:00:00 | NDC 00409131203

## 2021-04-13 MED ADMIN — HYDROMORPHONE (PF) 2 MG/ML IJ SYRG [163476]: 0.5 mg | INTRAVENOUS | @ 10:00:00 | Stop: 2021-04-13 | NDC 00409131203

## 2021-04-13 MED ADMIN — HYDROMORPHONE (PF) 2 MG/ML IJ SYRG [163476]: 1 mg | INTRAVENOUS | @ 20:00:00 | NDC 00409131203

## 2021-04-13 NOTE — Progress Notes
RT Adult Assessment Note    NAME:Karen Barr             MRN: [id number]             DOB:04-13-1986          AGE: 35 y.o.  ADMISSION DATE: 04/12/2021             DAYS ADMITTED: LOS: 0 days    RT Treatment Plan:     criteria not met       Additional Comments:  Impressions of the patient: AA&O, no respiratory distress noted  Intervention(s)/outcome(s): n/a  Patient education that was completed: n/a  Recommendations to the care team: pt does not meet RT protocol at this time    Vital Signs:  Pulse: 61  RR: 18 PER MINUTE  SpO2: 98 %  O2 Device: None (Room air)  Liter Flow:    O2%:    Breath Sounds: Clear (Implies normal)  Respiratory Effort: Non-Labored

## 2021-04-14 ENCOUNTER — Encounter: Admit: 2021-04-14 | Discharge: 2021-04-14 | Payer: No Typology Code available for payment source

## 2021-04-14 DIAGNOSIS — K921 Melena: Secondary | ICD-10-CM

## 2021-04-14 DIAGNOSIS — F32A Depression: Secondary | ICD-10-CM

## 2021-04-14 MED ADMIN — HYDROMORPHONE (PF) 2 MG/ML IJ SYRG [163476]: 1 mg | INTRAVENOUS | @ 18:00:00 | NDC 00409131203

## 2021-04-14 MED ADMIN — HYDROMORPHONE (PF) 2 MG/ML IJ SYRG [163476]: 1 mg | INTRAVENOUS | @ 10:00:00 | NDC 00409131203

## 2021-04-14 MED ADMIN — HYDROMORPHONE (PF) 2 MG/ML IJ SYRG [163476]: 1 mg | INTRAVENOUS | @ 04:00:00 | NDC 00409131203

## 2021-04-14 MED ADMIN — POLYETHYLENE GLYCOL 3350 17 GRAM PO PWPK [25424]: 17 g | ORAL | @ 15:00:00 | NDC 00904693186

## 2021-04-14 MED ADMIN — HYDROMORPHONE (PF) 2 MG/ML IJ SYRG [163476]: 1 mg | INTRAVENOUS | @ 13:00:00 | NDC 00409131203

## 2021-04-14 MED ADMIN — HYDROMORPHONE (PF) 2 MG/ML IJ SYRG [163476]: 1 mg | INTRAVENOUS | @ 02:00:00 | NDC 00409131203

## 2021-04-14 MED ADMIN — HYDROMORPHONE (PF) 2 MG/ML IJ SYRG [163476]: 1 mg | INTRAVENOUS | @ 21:00:00 | NDC 00409131203

## 2021-04-15 MED ADMIN — HYDROMORPHONE (PF) 2 MG/ML IJ SYRG [163476]: 1 mg | INTRAVENOUS | @ 04:00:00 | NDC 00409131203

## 2021-04-15 MED ADMIN — HYDROMORPHONE (PF) 2 MG/ML IJ SYRG [163476]: 1 mg | INTRAVENOUS | @ 01:00:00 | NDC 00409131203

## 2021-04-15 MED ADMIN — POLYETHYLENE GLYCOL 3350 17 GRAM PO PWPK [25424]: 17 g | ORAL | @ 13:00:00 | Stop: 2021-04-15 | NDC 00904693186

## 2021-04-15 MED ADMIN — HYDROMORPHONE (PF) 2 MG/ML IJ SYRG [163476]: 1 mg | INTRAVENOUS | @ 13:00:00 | Stop: 2021-04-15 | NDC 00409131203

## 2021-04-15 MED ADMIN — ACETAMINOPHEN 500 MG PO TAB [102]: 1000 mg | ORAL | @ 13:00:00 | NDC 00904673061

## 2021-04-15 MED ADMIN — ACETAMINOPHEN 500 MG PO TAB [102]: 1000 mg | ORAL | @ 20:00:00 | NDC 00904673080

## 2021-04-15 MED ADMIN — HYDROMORPHONE (PF) 2 MG/ML IJ SYRG [163476]: 1 mg | INTRAVENOUS | @ 09:00:00 | Stop: 2021-04-15 | NDC 00409131203

## 2021-04-15 MED ADMIN — HYDROMORPHONE (PF) 2 MG/ML IJ SYRG [163476]: 1 mg | INTRAVENOUS | @ 20:00:00 | NDC 00409131203

## 2021-04-16 ENCOUNTER — Encounter: Admit: 2021-04-16 | Discharge: 2021-04-16 | Payer: No Typology Code available for payment source

## 2021-04-16 MED ADMIN — POLYETHYLENE GLYCOL 3350 17 GRAM PO PWPK [25424]: 17 g | ORAL | @ 15:00:00 | Stop: 2021-04-16 | NDC 00904693186

## 2021-04-16 MED ADMIN — POLYETHYLENE GLYCOL 3350 17 GRAM PO PWPK [25424]: 17 g | ORAL | @ 03:00:00 | NDC 00904693186

## 2021-04-16 MED ADMIN — ACETAMINOPHEN 500 MG PO TAB [102]: 1000 mg | ORAL | @ 15:00:00 | Stop: 2021-04-16 | NDC 00904673080

## 2021-04-16 MED ADMIN — HYDROMORPHONE (PF) 2 MG/ML IJ SYRG [163476]: 1 mg | INTRAVENOUS | @ 03:00:00 | NDC 00409131203

## 2021-04-16 MED ADMIN — ACETAMINOPHEN 500 MG PO TAB [102]: 1000 mg | ORAL | @ 03:00:00 | NDC 00904673080

## 2021-04-17 ENCOUNTER — Encounter: Admit: 2021-04-17 | Discharge: 2021-04-17 | Payer: No Typology Code available for payment source

## 2021-04-17 NOTE — Telephone Encounter
Herbert Moors Key: [id number] - [place] Case ID: [id number] - Rx #: [id number] Need help? Call us at [phone]  Status  Sent to [employer]  Drug  Lidocaine 5% patches  Form  [employer] [facility] Electronic Prior Authorization Form ([employer]) 2017 NCPDP  Original Claim Info  75 [place] RQRD. CALL [phone] LIMITATIONS EXCEEDED

## 2021-04-17 NOTE — Telephone Encounter
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 No  If yes, reason:N/A  Hospital Services: Unplanned  Today's call is 1(business) days post discharge    Discharge Instruction Review   Did patient receive and understand discharge instructions? Yes    Home Health ordered? No                 Agency name/telephone number: N/A   Has Home Health agency contacted patient? N/A   Caregiver assistance in the home? No   Are there concerns regarding the patient's ADL'S? No  Is patient a fall risk? No    Special diet? No If yes, type: Regular diet      Medication Reconciliation    Changes to pre-hospital medications? No  Were new prescriptions filled?Yes Didn't get Lidocaine patch and emu oil will be sent to patient as not ready  START taking:  amitriptyline/gabapentin/emu oil(#) 11/18/08 %  folic acid (FOLVITE)  lidocaine (LIDODERM)  oxyCODONE  Meds reviewed and reconciled?Yes  ? acetaminophen (TYLENOL EXTRA STRENGTH) 500 mg tablet Take 1,000 mg by mouth every 8 hours as needed for Pain. Max of 4,000 mg of acetaminophen in 24 hours.   ? AMITR/GABAPEN/EMU OIL 11/18/08% CREAM (COMPOUND) Apply  topically to affected area every 8 hours as needed.   ? cholecalciferol (vitamin D3) (VITAMIN D3 PO) Take  by mouth daily.   ? fluticasone propionate (FLONASE) 50 mcg/actuation nasal spray, suspension Apply 2 sprays to each nostril as directed daily. Shake bottle gently before using.   ? folic acid (FOLVITE) 1 mg tablet Take one tablet by mouth daily.   ? lidocaine (LIDODERM) 5 % topical patch Apply one patch topically to affected area every 24 hours as needed. Apply patch for 12 hours, then remove for 12 hours before repeating.   ? menthol/camphor (BIOFREEZE TP) Apply  topically to affected area as Needed.   ? mesalamine (CANASA) 1,000 mg rectal suppository Insert or Apply one suppository to rectal area as directed at bedtime daily.   ? oxyCODONE 10 mg tablet Take one tablet by mouth every 8 hours as needed for Pain for up to 5 days.   ? sertraline (ZOLOFT) 25 mg tablet Take one tablet by mouth daily.   ? vitamins, prenatal w/iron & folate 65/1 mg tab Take 1 tablet by mouth daily.     Taking Miralax as needed    Understanding Condition   Having any current symptoms? Yes, Pain/discomfortleft neck, left shoulder, left arm with twinges in back. Has little lightheadedness, shortness of breath a little from pain. Has little constipation. Denies fever, headache, chest pain, nausea, vomiting, cough, congestion, dysuria, burning of urination, diarrhea, swelling of legs/feet.   Patient understands when to seek additional medical care? Yes   Other instructions provided : Reviewed AVS with patient     Scheduling Follow-up Appointment   Upcoming appointment date and time and with whom scheduled:   Future  Appointments   Date Time Provider Department Center   05/08/2021  9:40 AM [name], MD [facility]   06/06/2021 10:20 AM [name], MD [facility]   07/03/2021  2:20 PM [name], MD [facility]     PCP appointment scheduled?Yes, Date: 05/08/2021   PCP primary location: [facility]  Specialist appointment scheduled? No  Both PCP and Specialist appointment scheduled: No  Is assistance with transportation needed?No   MyChart message sent? Active in MyChart. No message sent.     Donnelly Stager, RN

## 2021-04-17 NOTE — Telephone Encounter
Noted.Will Follow As Needed.

## 2021-04-22 ENCOUNTER — Encounter: Admit: 2021-04-22 | Discharge: 2021-04-22 | Payer: No Typology Code available for payment source

## 2021-04-23 ENCOUNTER — Encounter: Admit: 2021-04-23 | Discharge: 2021-04-23 | Payer: No Typology Code available for payment source

## 2021-04-25 ENCOUNTER — Encounter: Admit: 2021-04-25 | Discharge: 2021-04-25 | Payer: No Typology Code available for payment source

## 2021-04-27 MED FILL — RXAMB AMITR/GABAPEN/EMU OIL 4/4/10% CREAM (COMPOUND): TOPICAL | 17 days supply | Qty: 2 | Fill #1 | Status: AC

## 2021-04-29 ENCOUNTER — Ambulatory Visit: Admit: 2021-04-29 | Discharge: 2021-04-29 | Payer: No Typology Code available for payment source

## 2021-04-29 ENCOUNTER — Encounter: Admit: 2021-04-29 | Discharge: 2021-04-29 | Payer: No Typology Code available for payment source

## 2021-04-29 DIAGNOSIS — Z1379 Encounter for other screening for genetic and chromosomal anomalies: Secondary | ICD-10-CM

## 2021-04-29 DIAGNOSIS — Z3401 Encounter for supervision of normal first pregnancy, first trimester: Secondary | ICD-10-CM

## 2021-04-29 DIAGNOSIS — Z315 Encounter for genetic counseling: Secondary | ICD-10-CM

## 2021-04-29 DIAGNOSIS — Z822 Family history of deafness and hearing loss: Secondary | ICD-10-CM

## 2021-04-29 DIAGNOSIS — Z31430 Encounter of female for testing for genetic disease carrier status for procreative management: Secondary | ICD-10-CM

## 2021-04-29 DIAGNOSIS — Z8269 Family history of other diseases of the musculoskeletal system and connective tissue: Secondary | ICD-10-CM

## 2021-04-29 DIAGNOSIS — O09512 Supervision of elderly primigravida, second trimester: Secondary | ICD-10-CM

## 2021-04-29 NOTE — Progress Notes
**Note De-Identified Karen Barr Obfuscation** [name] and her wife Karen Barr were seen by Karen Barr. Karen Michaux, MS, CGC for Genetic Counseling for greater than 30 minutes. Karen Barr is a 35 year old G1P0, Caucasian female referred to [place] for maternal age.  We discussed that the risk for having a child with a chromosome abnormality increases with advancing maternal age. Specifically, at age 77 at delivery there is a 1 in 75 chance for a baby to have Down syndrome and a 1 in 178 chance for any chromosome abnormality to occur.     We reviewed the results of the ultrasound examination done on 04/15/21 for screening for chromosome aneuploidy. This first trimester ultrasound included sonographic measurements of the nuchal translucency, nasal bone, ductus venosus and tricuspid valve waveforms. The nuchal translucency was 1.5 mm which is within normal range for the gestational age. The nasal bone is present and the tricuspid valve and ductus venosus waveforms appear normal.                                          In addition to ultrasound imaging, screening for fetal aneuploidy can be performed through the analysis of cell-free fetal DNA found in maternal serum. DNA fragments which originate from fetal tissues are found in maternal serum and can be analyzed through massively parallel sequencing. Currently, only aneuploidy for chromosomes 13, 18, 21, X and Y and select microdeletions is routinely reported.  Although the testing is highly sensitive and specific, it is considered a screening test and positive results should be followed by diagnostic testing such as amniocentesis.     Invasive testing such as Chorionic Villus Sampling and amniocentesis were also discussed. Chorionic Villus Sampling is performed before 14 weeks of gestation and involves removing a small sample of the developing placenta which generally has the same chromosomal constitution as the fetus. Amniocentesis is performed at greater than 15 weeks and involves withdrawing a sample of amniotic fluid which contains cells of fetal origin. The risk for miscarriage associated with genetic amniocentesis is approximately 1 in 500, while CVS has a risk of 1 in 200. This risk is primarily due to infection, bleeding or leakage of amniotic fluid. Cells collected by either CVS or amniocentesis are grown in culture for analysis of the fetal chromosomes. Karyotype analysis is 99.6% accurate for detecting Down syndrome and other chromosome abnormalities. Additionally, cells can be used for other testing such as microarray analysis and single gene testing as indicated.     After a discussion of these testing options, Karen Barr elected to have non-invasive prenatal screening through cell free DNA analysis. A sample was collected and submitted, and results are expected in 10-14 days. As NT evaluation does not assess the risk for neural tube defects, AFP screening is recommended at greater than 15 weeks.     As part of routine screening, Karen Barr was also offered an expanded genetic carrier screening panel which includes Cystic Fibrosis, Fragile X syndrome, Hemoglobinopathies, and Spinal Muscular Atrophy. These disorders are common in the general population and individuals are often unaware that they are carriers.     Karen Barr and Karen Barr report that their pregnancy was achieved by donor insemination utilizing anonymous donor sperm. Carrier screening was completed on the donor, and he is a carrier for Non-Syndromic Hearing Loss, GJB2-Related. A copy of his report was provided and reviewed. Karen Barr has not had carrier screening completed but reports a family history of congenital  hearing loss in her maternal half-aunt. Because GJB2-related Non-Syndromic Hearing Loss is a common hereditary cause for deafness, carrier screening is recommended for Karen Barr. We discussed options for a small screening panel to include only genes for which the donor has been previously screened versus a larger screening panel. She elected to have a sample collected and submitted for a 274 gene carrier screening panel which is a larger panel than was completed for the donor. Karen Barr was informed that if she is a carrier for a recessive condition for which the donor was not screened, additional carrier screening will be recommended for the donor. Karen Barr understands that additional donor screening may not be available and still elects to have the 274-carrier screening panel.     In addition to her family history of deafness, Karen Barr reports that she has a different maternal half-aunt who has Osteogenesis Imperfecta (OI) characterized by short stature (4?11?), multiple fractures and dental issues. 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 Because there is no other known family history of OI, Karen Barr?s aunt likely has a new mutation; thus, this history is not expected to increase Karen Barr?s chance to have a child with OI above that of the population. Karen Barr reports no other known family members with birth defects, intellectual delays, hereditary conditions, or multiple unexplained pregnancy losses in her family.

## 2021-05-07 ENCOUNTER — Encounter: Admit: 2021-05-07 | Discharge: 2021-05-07 | Payer: No Typology Code available for payment source

## 2021-05-07 NOTE — Telephone Encounter
Results of non-invasive prenatal screening indicate a low risk for Trisomy 13, 18 and 21.    Chromosome Result Probability   Trisomy 21 Low risk <1 in 10,000   Trisomy 18 Low risk <1 in 10,000   Trisomy 13 Low risk <1 in 10,000   Monosomy X Low risk <1 in 10,000   Triploidy/Vanishing Twin Low risk N/A   22q11.2 deletion Low risk 1 in 12,000   Fetal Sex Female >99 percent   Fetal Fraction 8.3%      The patient was counseled that these results are limited to the determination of aneuploidy only for the chromosomes tested, and not a complete analysis of all chromosomes.  Results from this test do not eliminate the possibility that other chromosomal abnormalities may exist in this pregnancy and a negative result does not ensure an unaffected pregnancy.  While results of this testing are highly accurate, not all chromosome abnormalities may be detected due to placental, maternal or fetal mosaicism, or other causes.  If additional prenatal findings are highly suggestive of aneuploidy, amniocentesis would be recommended.    These results were conveyed to the patient.     Await results of carrier screening for recessive and X-linked disorders.  If the patient is determined to be a carrier of a recessive genetic disorder, testing of the partner is recommended to assess the risk of having an affected child.

## 2021-05-07 NOTE — Progress Notes
Subjective:      35 y.o. y/o G1P0 @ [redacted]w[redacted]d by LMP of Patient's last menstrual period was 01/27/2020 (exact date). c/w 1st trimester [place]. with Estimated Date of Delivery: 10/26/21  no complaints. Back pain is improved, working with chiropractor    -VB, -LOF, -Ctx, FM - not yet    Objective:   - see flowsheet       Prenatal labs A NEG, Ab-, Rubella Equivocal, HIV-, HBsAg-, Syph AB-, GC/CT -/-, Urine Culture no growth, GBS unk   Pap smear  Performed 07/30/2017 - no abnormalities identified   NT sonogram normal   AFP/Quad Screen (16-18wga) AFP ordered   Anatomy sono (18-20wga) scheduled   Last Ultrasound n/a   28wk labs, BF ed, back-up    GTT (24-28wga)    GBS (35-37wga) To be performed on or around 36 weeks   Flu vaccine Next visit   Tdap vaccine (27-36wga) To be administered on or around 28 weeks   Rhogam vaccine (24-28wga) To be administered on or around 28 weeks/    MMR vaccine await repeat rubella testing     Lab Results   Component Value Date    ABORHD A NEG 02/24/2021    ABSCRN NEG 02/24/2021    RUBELLA  02/24/2021     EQUIVOCAL, PLEASE SUBMIT ANOTHER SPECIMEN IN 4-6 WEEKS    SYPHILISABTO NEG 02/24/2021    HEPBSAG NONREACTIVE 02/24/2021    HIV12AGABSCN NONREACTIVE 02/24/2021     Urine Culture (last 7 months):  Lab Results   Component Value Date    CULTURE  02/24/2021     <10,000 CFU/ml  Urogenital and/or skin microbiota           Assessment and Plan:   Karen Barr is a 35 y.o. G1P0 at [redacted]w[redacted]d here for follow-up visit.    -Complications: none  -Plan for: breast feeding, [name] in room, n/a for contraception, possibly epidural for pain control  -Primary physician: Ar[facility] Lot physician: unknown  -Return precautions and anticipatory guidance given.   -RTC in 4 week(s) for routine OB care   Karen Barr was seen today for routine prenatal visit.    Diagnoses and all orders for this visit:    Encounter for supervision of normal first pregnancy in second trimester  -     ALPHA-FETOPROTEIN (AFP), SNGL MRKR SCRN, MATERNAL, SERUM; Future; Expected date: 05/08/2021    Rh negative status during pregnancy in second trimester      Patient Instructions   Go to lab next week for rubella and AFP labs  [email or web address]

## 2021-05-08 ENCOUNTER — Encounter: Admit: 2021-05-08 | Discharge: 2021-05-08 | Payer: No Typology Code available for payment source

## 2021-05-08 ENCOUNTER — Ambulatory Visit: Admit: 2021-05-08 | Discharge: 2021-05-08 | Payer: No Typology Code available for payment source

## 2021-05-08 DIAGNOSIS — K921 Melena: Secondary | ICD-10-CM

## 2021-05-08 DIAGNOSIS — Z3402 Encounter for supervision of normal first pregnancy, second trimester: Principal | ICD-10-CM

## 2021-05-08 DIAGNOSIS — F32A Depression: Secondary | ICD-10-CM

## 2021-05-08 DIAGNOSIS — O26892 Other specified pregnancy related conditions, second trimester: Secondary | ICD-10-CM

## 2021-05-12 ENCOUNTER — Encounter: Admit: 2021-05-12 | Discharge: 2021-05-12 | Payer: No Typology Code available for payment source

## 2021-05-12 DIAGNOSIS — Z1379 Encounter for other screening for genetic and chromosomal anomalies: Secondary | ICD-10-CM

## 2021-05-12 NOTE — Telephone Encounter
An expanded carrier screening panel for 274 recessive and X-linked genetic disorders was performed on the patient. Below are listed results for the most common genetic disorders recommended by ACOG and ACMG.  Results of the remaining disorders may be reviewed on the laboratory report.    The results reflect that the patient is of Caucasian ancestry.    These results are considered within the normal risk profile.    Condition Test Results Interpretation   Fragile X Syndrome CGG repeats of 36 and 24 Repeats <45 considered normal   Spinal Muscular Atrophy SMN1: 2 copies neg [id number]>G Residual carrier risk of 1 in 769   Cystic Fibrosis Negative for common variants Residual carrier risk of 1 in 2401   Alpha and Beta Globin Genes Negative for common variants Residual carrier risk reduced     The patient was reminded that carrier screening may reduce but not eliminate the risk for the tested conditions. In addition, this is not an exhaustive test for genetic disorders.      These results were communicated to the patient.

## 2021-05-16 ENCOUNTER — Encounter: Admit: 2021-05-16 | Discharge: 2021-05-16 | Payer: No Typology Code available for payment source

## 2021-05-22 ENCOUNTER — Ambulatory Visit: Admit: 2021-05-22 | Discharge: 2021-05-22 | Payer: No Typology Code available for payment source

## 2021-05-22 ENCOUNTER — Encounter: Admit: 2021-05-22 | Discharge: 2021-05-22 | Payer: No Typology Code available for payment source

## 2021-05-22 DIAGNOSIS — Z3402 Encounter for supervision of normal first pregnancy, second trimester: Secondary | ICD-10-CM

## 2021-05-22 DIAGNOSIS — Z3401 Encounter for supervision of normal first pregnancy, first trimester: Secondary | ICD-10-CM

## 2021-05-22 LAB — RUBELLA AB IGG

## 2021-05-30 ENCOUNTER — Encounter: Admit: 2021-05-30 | Discharge: 2021-05-30 | Payer: No Typology Code available for payment source

## 2021-06-03 NOTE — Progress Notes
Subjective:      35 y.o. 35 y.o. G1P0 @ [redacted]w[redacted]d by LMP of Patient's last menstrual period was 01/27/2020 (exact date). c/w 1st trimester [place]. with Estimated Date of Delivery: 10/26/21  C/o diarrhea, no rectal bleeding. No weight loss, no fever   Restarted mesalamine last night after having lost the box. Has been sedentary, now caught up with school  -VB, -LOF, -Ctx, +FM  Schedule for anatomy ultrasound.  Will need MMR booster after delivery  Objective:   - see flowsheet       Prenatal labs A NEG, Ab-, Rubella Equivocal, HIV-, HBsAg-, Syph AB-, GC/CT -/-, Urine Culture no growth, GBS unk   Pap smear  Performed 07/30/2017 - no abnormalities identified   NT sonogram normal   AFP/Quad Screen (16-18wga) AFP negative   Anatomy sono (18-20wga) scheduled   Last Ultrasound n/a   28wk labs, BF ed, back-up    GTT (24-28wga) To be done at 24 weeks   GBS (35-37wga) To be performed on or around 36 weeks   Flu vaccine Done 06/06/21 with covid vaccine   Tdap vaccine (27-36wga) To be administered on or around 28 weeks   Rhogam vaccine (24-28wga) To be administered on or around 28 weeks/    MMR vaccine MMR after delivery     Lab Results   Component Value Date    ABORHD A NEG 02/24/2021    ABSCRN NEG 02/24/2021    RUBELLA  05/22/2021     EQUIVOCAL, PLEASE SUBMIT ANOTHER SPECIMEN IN 4-6 WEEKS    SYPHILISABTO NEG 02/24/2021    HEPBSAG NONREACTIVE 02/24/2021    HIV12AGABSCN NONREACTIVE 02/24/2021     Urine Culture (last 7 months):  Lab Results   Component Value Date    CULTURE  02/24/2021     <10,000 CFU/ml  Urogenital and/or skin microbiota       FHT 145  Fundus at umbilicus          Assessment and Plan:   Karen Barr is a 35 y.o. G1P0 at [redacted]w[redacted]d here for follow-up visit.    -Complications: mild flare of colitis, restarting mesalamine. Will monitor. Has appt scheduled with GI  -Plan for: breast feeding, wife Karen Barr in room, n/a for contraception, epidural for pain control  -Primary physician: Ar[facility] Lot physician: Jeananne Rama  -Return precautions and anticipatory guidance given.   -RTC in 4 week(s) for routine OB care

## 2021-06-06 ENCOUNTER — Encounter: Admit: 2021-06-06 | Discharge: 2021-06-06 | Payer: No Typology Code available for payment source

## 2021-06-06 ENCOUNTER — Ambulatory Visit: Admit: 2021-06-06 | Discharge: 2021-06-06 | Payer: No Typology Code available for payment source

## 2021-06-06 DIAGNOSIS — Z3402 Encounter for supervision of normal first pregnancy, second trimester: Secondary | ICD-10-CM

## 2021-06-06 DIAGNOSIS — K921 Melena: Secondary | ICD-10-CM

## 2021-06-06 DIAGNOSIS — F32A Depression: Secondary | ICD-10-CM

## 2021-06-06 DIAGNOSIS — K51211 Ulcerative (chronic) proctitis with rectal bleeding: Secondary | ICD-10-CM

## 2021-06-06 DIAGNOSIS — Z23 Encounter for immunization: Secondary | ICD-10-CM

## 2021-06-06 NOTE — Progress Notes
Patient presents to clinic for COVID-19 injection. Injection given without difficulty in RD, VIS sheet and injection card given. Consent form signed. Patient monitored in clinic for 15 minutes with no signs or symptoms of adverse reaction. Flu vaccine administered in RD, VIS and consent given. Pt tolerated well.

## 2021-06-10 ENCOUNTER — Encounter: Admit: 2021-06-10 | Discharge: 2021-06-10 | Payer: No Typology Code available for payment source

## 2021-06-10 ENCOUNTER — Ambulatory Visit: Admit: 2021-06-10 | Discharge: 2021-06-10 | Payer: No Typology Code available for payment source

## 2021-06-10 DIAGNOSIS — O09512 Supervision of elderly primigravida, second trimester: Secondary | ICD-10-CM

## 2021-06-10 DIAGNOSIS — Z1379 Encounter for other screening for genetic and chromosomal anomalies: Secondary | ICD-10-CM

## 2021-07-03 ENCOUNTER — Encounter: Admit: 2021-07-03 | Discharge: 2021-07-03 | Payer: No Typology Code available for payment source

## 2021-07-03 ENCOUNTER — Ambulatory Visit: Admit: 2021-07-03 | Discharge: 2021-07-03 | Payer: No Typology Code available for payment source

## 2021-07-03 DIAGNOSIS — M5030 Other cervical disc degeneration, unspecified cervical region: Secondary | ICD-10-CM

## 2021-07-03 DIAGNOSIS — K921 Melena: Secondary | ICD-10-CM

## 2021-07-03 DIAGNOSIS — F32A Depression: Secondary | ICD-10-CM

## 2021-07-03 DIAGNOSIS — M5412 Radiculopathy, cervical region: Secondary | ICD-10-CM

## 2021-07-03 DIAGNOSIS — M62838 Other muscle spasm: Secondary | ICD-10-CM

## 2021-07-03 DIAGNOSIS — Z3A23 23 weeks gestation of pregnancy: Secondary | ICD-10-CM

## 2021-07-03 DIAGNOSIS — Z3402 Encounter for supervision of normal first pregnancy, second trimester: Secondary | ICD-10-CM

## 2021-07-03 MED ORDER — ASPIRIN 81 MG PO TBEC
81 mg | ORAL_TABLET | Freq: Every day | ORAL | 3 refills | Status: AC
Start: 2021-07-03 — End: ?

## 2021-07-03 NOTE — Progress Notes
Progress Note - Obstetric Outpatient     Encounter Date: 07/03/2021   Patient Name: Karen Barr   Date of Birth: [birth year]   MRN: [id number]   Encounter Provider [name], MD   Primary Care Physician:  [name], MD (General)       Subjective     Here for continued prenatal care.   Vaginal bleeding?  No  Loss of fluid? No  Contractions? No  Feeling fetal movement? Yes    Round ligament pain ? Yes    Current concerns: cervical radiculopathy has improved    Objective     Vitals:    07/03/21 1419   BP: 110/68   Pulse: 76   Resp: 12   SpO2: 100%       Gen: alert, in no distress, affect appropriate.   HEENT: NCAT  Abd: soft, non-tender, uterine fundus 23cm above pubic symphysis   Extremities:  no edema    FHT by doppler: 154       No results found for: URINERESULTS     Assessment/Plan     Karen Barr is a 35 y.o. G1P0 at 10/26/2021, by Ultrasound    Complications: No   Anatomy ultrasound:    Performed: Yes   Date: 06/10/21   Results: normal   Placenta: right lateral   Sex of fetus: female   Need for follow up: Yes 4 weeks for AMA, >BMI   Risk for pre-eclampsia reviewed: Determined to be moderate risk. Low dose aspirin was given     Discussed:    Fetal movement (not recommending kick counts but awareness of abnormalities)   Nutrition in pregnancy   Exercise guidance as baby grows     RTO 4 weeks.    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

## 2021-07-03 NOTE — Patient Instructions
General Instructions:  How to reach me: Please send a MyChart message to the [facility] or leave a voicemail for my nurse Rebecca at [phone].  Scheduling: Our scheduling phone number is [phone].  How to get a medication refill: Five business days before refill needed, please use the MyChart Refill request or contact your pharmacy directly to request medication refills.   How to receive your test results: If you have signed up for MyChart, you will receive your test results and messages from me this way. Otherwise, you will get a phone call or letter. If you are expecting results and have not heard from my office within 2 weeks of your testing, please send a MyChart message or call my office.  Support for many chronic illnesses is available through [facility]: [email or web address] or [phone].  For questions on nights, weekends or holidays, call the operator at [phone], and ask for the doctor on call for [facility].

## 2021-07-03 NOTE — Progress Notes
[facility] - Interventional Pain  Follow Up Visit  Subjective     Chief Complaint:   Chief Complaint   Patient presents with   ? Follow Up       HPI: Karen Barr is a 35 y.o. female who  has a past medical history of Blood in stool and Depression. who presents for evaluation.    She is 6 months pregnant.       She had CESI in 03/2021.     Her pain was pretty severe at the time.   She had just been in the ER a few days before that.     She then had a flare up of pain after the injection and was in the hospital.     The pain that gradually started to improve post-injection.     She now has essentially resolution of her pain.    She notes residual numbness/tingling in the left 2nd/3rd digit.     She feels there is some mild weakness in the left hand as well.            PRIOR MEDICATIONS:   Effective      Ineffective  Medrol dose pack was not beneficial  Acetaminophen    Unable to tolerate      Never  Gabapentin   Lyrica  Ami/Nortriptyline  Cymbalta  Tizanidine    PRIOR INTERVENTIONS:  No spine surgery  Effective  CESI  Physician-ordered PT  Chiropractic (little)    Ineffective          Karen Barr denies any recent fevers, chills, infection, antibiotics, bowel or bladder incontinence, saddle anesthesia, bleeding issues, or recent anticoagulant.     Past Medical History:  Medical History:   Diagnosis Date   ? Blood in stool    ? Depression        Family History:  Family History   Problem Relation Age of Onset   ? Diabetes Mother    ? High Cholesterol Mother    ? Hypertension Mother    ? Miscarriages Mother    ? Diabetes Sister    ? Cancer Maternal Grandmother    ? High Cholesterol Maternal Grandmother        Social History:  Lives in [place] [address]    Social History     Socioeconomic History   ? Marital status: Married     Spouse name: Karen Barr   ? Number of children: 0   Occupational History   ? Occupation: Arboriculturist   Tobacco Use   ? Smoking status: Never   ? Smokeless tobacco: Never   Vaping Use   ? Vaping Use: Never used   Substance and Sexual Activity   ? Alcohol use: Not Currently   ? Drug use: No   ? Sexual activity: Yes     Partners: Female       Allergies:  No Known Allergies    Medications:    Current Outpatient Medications:   ?  acetaminophen (TYLENOL EXTRA STRENGTH) 500 mg tablet, Take 1,000 mg by mouth Barr 8 hours as needed for Pain. Max of 4,000 mg of acetaminophen in 24 hours., Disp: , Rfl:   ?  AMITR/GABAPEN/EMU OIL 11/18/08% CREAM (COMPOUND), Apply  topically to affected area Barr 8 hours as needed., Disp: 50 g, Rfl: 1  ?  fluticasone propionate (FLONASE) 50 mcg/actuation nasal spray, suspension, Apply 2 sprays to each nostril as directed daily. Shake bottle gently before using., Disp: ,  Rfl:   ?  folic acid (FOLVITE) 1 mg tablet, Take one tablet by mouth daily., Disp: 90 tablet, Rfl: 1  ?  menthol/camphor (BIOFREEZE TP), Apply  topically to affected area as Needed., Disp: , Rfl:   ?  mesalamine (CANASA) 1,000 mg rectal suppository, Insert or Apply one suppository to rectal area as directed at bedtime daily., Disp: 30 suppository, Rfl: 5  ?  sertraline (ZOLOFT) 25 mg tablet, Take one tablet by mouth daily., Disp: 90 tablet, Rfl: 3  ?  vitamins, prenatal w/iron & folate 65/1 mg tab, Take 1 tablet by mouth daily., Disp: , Rfl:     Physical examination:   BP 118/76  - Pulse 85  - Ht 176 cm (5' 9.29)  - Wt 108.9 kg (240 lb)  - LMP 01/27/2020 (Exact Date) Comment: aprox 10 weeks preg - SpO2 100%  - BMI 35.14 kg/m?   Pain Score: Zero    General: The patient is a well-developed, well nourished 35 y.o. female in no acute distress.   HEENT: Head is normocephalic and atraumatic.   Cardiac: Based on palpation, pulse appears to be regular rate and rhythm.   Pulmonary: The patient has unlabored respirations and bilateral symmetric chest excursion.   Abdomen: Soft, nontender, and nondistended.   Extremities: No clubbing, cyanosis, or edema.     Neurologic:   The patient is alert and oriented times 3.     Musculoskeletal:     C-Spine   There is no paraspinal tenderness. Paraspinal muscle tone is mildly increased.  ROM with flexion, extension, rotation, and lateral bending is intact with minimal stiffness.  Strength is equal and adequate bilaterally in the flexors and extensors of the bilateral upper extremities.   Hoffman's is negative bilaterally.      MRI C-Spine Results:    Results for orders placed during the hospital encounter of 04/06/21    MRI C-SPINE WO CONTRAST    Narrative  EXAM: MRI C-SPINE    HISTORY:    Neck/back pain radiating down the left upper extremity resulting in weakness and numbness. Approximately [redacted] weeks pregnant.    Technique: Multiple sagittal and axial MR sequences were obtained of the cervical spine.    Comparison: None    FINDINGS:    Slight reversal of the cervical lordosis centered at C5-C6. The vertebral body heights are maintained. Diffuse marrow T1 hypointensity. The cervical cord is normal in signal.    At C5-C6: Eccentric right-sided posterior disc osteophyte complex and ligamentum flavum thickening results in moderate central spinal stenosis.    At C6-C7: Left posterior disc osteophyte complex and disc herniation and ligamentum flavum thickening resulting in severe central spinal stenosis with cord compression, although, no obvious cord edema.    Degenerative disc disease and uncovertebral arthrosis results in multilevel neural foraminal stenosis, most pronounced and of at least moderate degree on the left at C6-C7 and at least mild degree on the right at C5-C6 and C6-C7, although, evaluation of the foramina is limited due to patient motion on axial imaging. Tiny perineural root sleeve cysts cysts on the left at C5-6 and C6-C7.    The paraspinous soft tissues are unremarkable.    Impression  1.  Left posterior disc osteophyte complex and disc herniation at C6-C7 with severe central spinal stenosis and cord deformation, though, no obvious cord edema.  2.  Moderate degenerative central spinal stenosis (eccentric to the right at C5-C6).  3.  Multilevel degenerative neural foraminal stenosis, most pronounced of at least  moderate degree on the left at C6-C7, although, evaluation of the foramina is limited due to patient motion on axial imaging.  4.  T1 hypointense marrow signal which may be physiologic, though, marrow hyperplasia could appear similar.      Approved by Gwinda Passe, MD on 04/06/2021 12:59 PM    By my electronic signature, I attest that I have personally reviewed the images for this examination and formulated the interpretations and opinions expressed in this report      Finalized by Desma Maxim, M.D. on 04/06/2021 1:47 PM. Dictated by Gwinda Passe, MD on 04/06/2021 12:32 PM.          Last Cr and LFT's:  Creatinine   Date Value Ref Range Status   04/16/2021 0.54 0.4 - 1.00 MG/DL Final     AST (SGOT)   Date Value Ref Range Status   04/16/2021 30 7 - 40 U/L Final     ALT (SGPT)   Date Value Ref Range Status   04/16/2021 69 (H) 7 - 56 U/L Final     Alk Phosphatase   Date Value Ref Range Status   04/16/2021 47 25 - 110 U/L Final     Total Bilirubin   Date Value Ref Range Status   04/16/2021 0.3 0.3 - 1.2 MG/DL Final          Assessment:    Vala Avance is a 35 y.o. female who  has a past medical history of Blood in stool and Depression. who presents for evaluation of pain.    The pain complaints are most likely due to:    1. Cervical radiculopathy        2. DDD (degenerative disc disease), cervical        3. Muscle spasm              Plan:  1. Will proceed with conservative care in light of her pregnancy. The pain has been nearly eliminated with CESI. Can take acetaminophen 500-1000mg  bid prn.   2. Continue PT.   3. Avoid heavy lifting.   4. Can re-evaluate post-pregnancy and would likely repeat CESI at that time to potentially improve residual N/T. Would also consider surgical evaluation at that time.   5. Follow up as needed.    Risks/benefits of all pharmacologic and interventional treatments discussed and questions answered.

## 2021-07-08 ENCOUNTER — Encounter: Admit: 2021-07-08 | Discharge: 2021-07-08 | Payer: No Typology Code available for payment source

## 2021-07-08 ENCOUNTER — Ambulatory Visit: Admit: 2021-07-08 | Discharge: 2021-07-08 | Payer: No Typology Code available for payment source

## 2021-07-08 DIAGNOSIS — O99212 Obesity complicating pregnancy, second trimester: Principal | ICD-10-CM

## 2021-07-16 ENCOUNTER — Encounter: Admit: 2021-07-16 | Discharge: 2021-07-16 | Payer: No Typology Code available for payment source

## 2021-07-17 ENCOUNTER — Encounter: Admit: 2021-07-17 | Discharge: 2021-07-17 | Payer: No Typology Code available for payment source

## 2021-07-31 ENCOUNTER — Ambulatory Visit: Admit: 2021-07-31 | Discharge: 2021-07-31 | Payer: No Typology Code available for payment source

## 2021-07-31 ENCOUNTER — Encounter: Admit: 2021-07-31 | Discharge: 2021-07-31 | Payer: No Typology Code available for payment source

## 2021-08-01 ENCOUNTER — Ambulatory Visit: Admit: 2021-08-01 | Discharge: 2021-08-01 | Payer: No Typology Code available for payment source

## 2021-08-01 ENCOUNTER — Encounter: Admit: 2021-08-01 | Discharge: 2021-08-01 | Payer: No Typology Code available for payment source

## 2021-08-01 DIAGNOSIS — Z3402 Encounter for supervision of normal first pregnancy, second trimester: Secondary | ICD-10-CM

## 2021-08-01 LAB — GLUCOSE TOL-1 HR: GLUCOSE, 1 HR.: 116 mg/dL (ref 7–40)

## 2021-08-04 ENCOUNTER — Encounter: Admit: 2021-08-04 | Discharge: 2021-08-04 | Payer: No Typology Code available for payment source

## 2021-08-04 ENCOUNTER — Ambulatory Visit: Admit: 2021-08-04 | Discharge: 2021-08-04 | Payer: No Typology Code available for payment source

## 2021-08-04 DIAGNOSIS — F32A Depression: Secondary | ICD-10-CM

## 2021-08-04 DIAGNOSIS — Z3A28 28 weeks gestation of pregnancy: Principal | ICD-10-CM

## 2021-08-04 DIAGNOSIS — K921 Melena: Secondary | ICD-10-CM

## 2021-08-04 LAB — CBC
HEMATOCRIT: 31 % — ABNORMAL LOW (ref 36–45)
HEMOGLOBIN: 10 g/dL — ABNORMAL LOW (ref 12.0–15.0)
MCH: 28 pg (ref 26–34)
MCHC: 33 g/dL (ref 32.0–36.0)
MCV: 84 FL (ref 80–100)
PLATELET COUNT: 415 K/UL — ABNORMAL HIGH (ref 150–400)
RBC COUNT: 3.7 M/UL — ABNORMAL LOW (ref 4.0–5.0)
RDW: 13 % (ref 11–15)
WBC COUNT: 13 K/UL — ABNORMAL HIGH (ref 4.5–11.0)

## 2021-08-05 ENCOUNTER — Encounter: Admit: 2021-08-05 | Discharge: 2021-08-05 | Payer: No Typology Code available for payment source

## 2021-08-05 ENCOUNTER — Ambulatory Visit: Admit: 2021-08-05 | Discharge: 2021-08-05 | Payer: No Typology Code available for payment source

## 2021-08-05 DIAGNOSIS — O99213 Obesity complicating pregnancy, third trimester: Secondary | ICD-10-CM

## 2021-08-06 ENCOUNTER — Encounter: Admit: 2021-08-06 | Discharge: 2021-08-06 | Payer: No Typology Code available for payment source

## 2021-08-06 MED ORDER — FERROUS SULFATE 325 MG (65 MG IRON) PO TAB
325 mg | ORAL_TABLET | ORAL | 0 refills | Status: CN
Start: 2021-08-06 — End: ?

## 2021-08-07 MED FILL — FERROUS SULFATE 325 MG (65 MG IRON) PO TAB: 325 mg (65 mg iron) | ORAL | 34 days supply | Qty: 17 | Fill #1 | Status: AC

## 2021-08-08 ENCOUNTER — Encounter: Admit: 2021-08-08 | Discharge: 2021-08-08 | Payer: No Typology Code available for payment source

## 2021-08-14 ENCOUNTER — Ambulatory Visit: Admit: 2021-08-14 | Discharge: 2021-08-14 | Payer: No Typology Code available for payment source

## 2021-08-14 ENCOUNTER — Encounter: Admit: 2021-08-14 | Discharge: 2021-08-14 | Payer: No Typology Code available for payment source

## 2021-08-14 DIAGNOSIS — O26893 Other specified pregnancy related conditions, third trimester: Secondary | ICD-10-CM

## 2021-08-14 NOTE — Progress Notes
Pt. completed labs and Rhogam injection given in right vental gluteal with out incident and consent given.

## 2021-08-22 ENCOUNTER — Ambulatory Visit: Admit: 2021-08-22 | Discharge: 2021-08-22 | Payer: No Typology Code available for payment source

## 2021-08-22 ENCOUNTER — Encounter: Admit: 2021-08-22 | Discharge: 2021-08-22 | Payer: No Typology Code available for payment source

## 2021-08-22 DIAGNOSIS — O09513 Supervision of elderly primigravida, third trimester: Secondary | ICD-10-CM

## 2021-08-22 DIAGNOSIS — K921 Melena: Secondary | ICD-10-CM

## 2021-08-22 DIAGNOSIS — F32A Depression: Secondary | ICD-10-CM

## 2021-08-22 DIAGNOSIS — Z3403 Encounter for supervision of normal first pregnancy, third trimester: Secondary | ICD-10-CM

## 2021-08-22 DIAGNOSIS — O99213 Obesity complicating pregnancy, third trimester: Secondary | ICD-10-CM

## 2021-08-22 NOTE — Progress Notes
Progress Note - Obstetric Outpatient     Encounter Date: 08/22/2021   Patient Name: Karen Barr   Date of Birth: 12/27/1985   MRN: [id number]   Encounter Provider Lurlean Leyden, MD   Primary Care Physician:  Lurlean Leyden, MD (General)       Subjective     Here for continued prenatal care.   Vaginal bleeding?  No  Loss of fluid? No  Contractions? No  Feeling fetal movement? Yes    Current concerns: has follow up with GI.     Objective   There were no vitals filed for this visit.    Gen: alert, in no distress, affect appropriate.   HEENT: NCAT  Abd: soft, non-tender, uterine fundus 29cm above pubic symphysis   Extremities:  no edema    FHT by doppler: 155     No results found for: URINERESULTS     Assessment/Plan     Karen Barr is a 36 y.o. G1P0 at [redacted]w[redacted]d, by LMP/Ultrasound    Complications: Yes  AMA, BMI 38   GTT reviewed: normal   CBC reviewed: abnormal - slightly elevated WBC   Rhogam: given 08/14/2021. (If indicated, patient needs a type and screen on file 24 hours or less before being given. See instructions for ordering in clinic)   TDaP: given     Discussed:    Signs of preterm labor   Plans for anesthesia   Plans for feeding      RTO 2 weeks.    Patient Active Problem List    Diagnosis Date Noted    [redacted] weeks gestation of pregnancy 04/12/2021    Spinal stenosis of cervical region 04/12/2021    Degenerative disc disease, cervical 04/12/2021    Moderate episode of recurrent major depressive disorder ([place]) 08/22/2020    Ulcerative (chronic) proctitis with rectal bleeding ([place]) 08/22/2020    Conductive hearing loss, bilateral 06/28/2020    Complex tear of lateral meniscus of left knee as current injury 03/19/2020    Hand injury, right, sequela 07/30/2017

## 2021-08-22 NOTE — Patient Instructions
Go to lab

## 2021-08-29 ENCOUNTER — Ambulatory Visit: Admit: 2021-08-29 | Discharge: 2021-08-29 | Payer: No Typology Code available for payment source

## 2021-08-29 ENCOUNTER — Encounter: Admit: 2021-08-29 | Discharge: 2021-08-29 | Payer: No Typology Code available for payment source

## 2021-08-29 DIAGNOSIS — Z3403 Encounter for supervision of normal first pregnancy, third trimester: Secondary | ICD-10-CM

## 2021-08-29 LAB — CBC AND DIFF
ABSOLUTE EOS COUNT: 0 K/UL — AB (ref 0–0.45)
ABSOLUTE LYMPH COUNT: 0.9 K/UL — ABNORMAL LOW (ref 1.0–4.8)
ABSOLUTE NEUTROPHIL: 8.5 K/UL — ABNORMAL HIGH (ref 1.8–7.0)
BASOPHILS %: 0 % (ref 0–2)
EOSINOPHILS %: 1 % (ref 0–5)
LYMPHOCYTES %: 9 % — ABNORMAL LOW (ref 24–44)
MCH: 27 pg (ref >60)
MCHC: 33 g/dL (ref 32.0–36.0)
MONOCYTES %: 5 % (ref 4–12)
MPV: 8.2 FL (ref 7–11)
NEUTROPHILS %: 85 % — ABNORMAL HIGH (ref 41–77)
PLATELET COUNT: 402 K/UL — ABNORMAL HIGH (ref 150–400)
RBC COUNT: 3.7 M/UL — ABNORMAL LOW (ref 4.0–5.0)
RDW: 13 % (ref 11–15)
WBC COUNT: 10 K/UL (ref 4.5–11.0)

## 2021-08-29 LAB — SYPHILIS AB SCREEN: SYPHILIS AB, TOTAL: NEGATIVE g/dL — ABNORMAL LOW (ref 12.0–15.0)

## 2021-09-02 NOTE — Progress Notes
Progress Note - Obstetric Outpatient     Encounter Date: 09/04/2021   Patient Name: Karen Barr   Date of Birth: [birth year]   MRN: [id number]   Encounter Provider [name], MD   Primary Care Physician:  [name], MD (General)       Subjective     Here for continued prenatal care.   Vaginal bleeding?  No  Loss of fluid? No  Contractions? Yes  Feeling fetal movement? Yes    Current concerns: has some anxiety regarding breastfeeding and h/o trauma when she was a child. Is concerned re: how breastfeeding will go after delivery    Objective     Vitals:    09/04/21 1435   BP: 111/77   Pulse: 92   Resp: 16   SpO2: 100%       Gen: alert, in no distress, affect appropriate.   HEENT: NCAT  Abd: soft, non-tender, uterine fundus 31 cm above pubic symphysis   Extremities:  mild edema    FHT by doppler: 145-150  Vertex by BSUS     No results found for: URINERESULTS     Assessment/Plan     Karen Barr is a 36 y.o. G1P0 at [redacted]w[redacted]d EDD 10/26/2021, by LMP and Ultrasound   ? Complications: Yes  AMA, h/o UC  ? Third trimester labs reviewed. normal  ? TDaP received: Yes    ? Discussed:   ? Signs of preterm labor  ? Feeling safe at home  ? Weight gain in pregnancy  ? Circumcision: desired     RTO 2 weeks.  Breastfeeding class: [email or web address]  Labor and delivery: [email or web address]  Spoke with Dr. Reinaldo Raddle, will refer to [facility] to discuss BF anxiety    Patient Active Problem List    Diagnosis Date Noted   ? [redacted] weeks gestation of pregnancy 04/12/2021   ? Spinal stenosis of cervical region 04/12/2021   ? Degenerative disc disease, cervical 04/12/2021   ? Moderate episode of recurrent major depressive disorder (HCC) 08/22/2020   ? 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 Conductive hearing loss, bilateral 06/28/2020   ? Complex tear of lateral meniscus of left knee as current injury 03/19/2020   ? Hand injury, right, sequela 07/30/2017

## 2021-09-04 ENCOUNTER — Ambulatory Visit: Admit: 2021-09-04 | Discharge: 2021-09-04 | Payer: No Typology Code available for payment source

## 2021-09-04 ENCOUNTER — Encounter: Admit: 2021-09-04 | Discharge: 2021-09-04 | Payer: No Typology Code available for payment source

## 2021-09-04 DIAGNOSIS — F32A Depression: Secondary | ICD-10-CM

## 2021-09-04 DIAGNOSIS — F418 Other specified anxiety disorders: Secondary | ICD-10-CM

## 2021-09-04 DIAGNOSIS — O09513 Supervision of elderly primigravida, third trimester: Secondary | ICD-10-CM

## 2021-09-04 DIAGNOSIS — Z3403 Encounter for supervision of normal first pregnancy, third trimester: Secondary | ICD-10-CM

## 2021-09-04 DIAGNOSIS — O99213 Obesity complicating pregnancy, third trimester: Secondary | ICD-10-CM

## 2021-09-04 DIAGNOSIS — K921 Melena: Secondary | ICD-10-CM

## 2021-09-04 NOTE — Patient Instructions
Breastfeeding class: [email or web address]  Labor and delivery: [email or web address]

## 2021-09-04 NOTE — Telephone Encounter
Pt scheduled [facility] appt for Thurs. 09/18/2021 at 1PM.    RT: Dr. Iran Sizer  RT: Dr. Huel Cote

## 2021-09-05 ENCOUNTER — Encounter: Admit: 2021-09-05 | Discharge: 2021-09-05 | Payer: No Typology Code available for payment source

## 2021-09-15 ENCOUNTER — Encounter: Admit: 2021-09-15 | Discharge: 2021-09-15 | Payer: No Typology Code available for payment source

## 2021-09-15 ENCOUNTER — Encounter: Admit: 2021-09-15 | Discharge: 2021-09-16 | Payer: No Typology Code available for payment source

## 2021-09-15 DIAGNOSIS — K921 Melena: Secondary | ICD-10-CM

## 2021-09-15 DIAGNOSIS — F32A Depression: Secondary | ICD-10-CM

## 2021-09-15 LAB — COMPREHENSIVE METABOLIC PANEL
ALBUMIN: 3.8 g/dL (ref 3.5–5.0)
ALK PHOSPHATASE: 73 U/L (ref 25–110)
ALT: 11 U/L (ref 7–56)
ANION GAP: 14 — ABNORMAL HIGH (ref 3–12)
AST: 36 U/L (ref 7–40)
BLD UREA NITROGEN: 7 mg/dL (ref 7–25)
CALCIUM: 9.6 mg/dL (ref 8.5–10.6)
CO2: 18 MMOL/L — ABNORMAL LOW (ref 21–30)
CREATININE: 0.5 mg/dL (ref 0.4–1.00)
EGFR: >60 mL/min (ref >60)
GLUCOSE,PANEL: 82 mg/dL (ref 70–100)
SODIUM: 135 MMOL/L — ABNORMAL LOW (ref 137–147)
TOTAL BILIRUBIN: 0.4 mg/dL (ref 0.3–1.2)
TOTAL PROTEIN: 7.3 g/dL (ref 6.0–8.0)

## 2021-09-15 LAB — LDH-LACTATE DEHYDROGENASE: LDH: 400 U/L — ABNORMAL HIGH (ref 100–210)

## 2021-09-15 LAB — PROTEIN/CR RATIO,UR RAN: UR TOTAL PROTEIN,RAN: 9 mg/dL

## 2021-09-15 LAB — URINALYSIS, MICROSCOPIC

## 2021-09-15 LAB — URINALYSIS DIPSTICK
LEUKOCYTES: NEGATIVE
NITRITE: NEGATIVE
URINE ASCORBIC ACID, UA: NEGATIVE
URINE BILE: NEGATIVE
URINE BLOOD: NEGATIVE

## 2021-09-15 LAB — CBC: WBC COUNT: 12 K/UL — ABNORMAL HIGH (ref 4.5–11.0)

## 2021-09-15 LAB — URIC ACID: URIC ACID: 4.1 mg/dL — ABNORMAL LOW (ref 2.0–7.0)

## 2021-09-15 MED ORDER — LACTATED RINGERS IV SOLP
1000 mL | INTRAVENOUS | 0 refills | Status: CP
Start: 2021-09-15 — End: ?
  Administered 2021-09-15: 23:00:00 1000 mL via INTRAVENOUS

## 2021-09-15 MED ORDER — ACETAMINOPHEN 500 MG PO TAB
1000 mg | Freq: Once | ORAL | 0 refills | Status: CP
Start: 2021-09-15 — End: ?
  Administered 2021-09-15: 23:00:00 1000 mg via ORAL

## 2021-09-15 MED ORDER — PROCHLORPERAZINE EDISYLATE 5 MG/ML IJ SOLN
10 mg | INTRAVENOUS | 0 refills | Status: DC | PRN
Start: 2021-09-15 — End: 2021-09-16
  Administered 2021-09-15: 23:00:00 10 mg via INTRAVENOUS

## 2021-09-15 NOTE — Telephone Encounter
Spoke with pt regarding voicemail. Pt reports having a headache since yesterday afternoon. Has taken tylenol. With only partial relief. Head ache has remained constant with only changes in intensity Has noticed her feet swelling. Swelling decreases with elevation of the feet. BP was 13/84 this morning, but has increased to 161/82. Per protocol, pt advised to go to L&D.for evaluation.     Reason for Disposition  ? Pregnant 20 or more weeks with Systolic BP >= 140 OR Diastolic BP >= 90    Answer Assessment - Initial Assessment Questions  1. LOCATION: Where does it hurt?       Back of head, behind R eye, right jaw  2. ONSET: When did the headache start? (e.g., minutes, hours or days)       Yesterday about noon  3. PATTERN: Does the pain come and go, or has it been constant since it started?      consistent yesterday, constant but intensity increases and decreases with activity  4. SEVERITY: How bad is the pain? and What does it keep you from doing?     - MILD - doesn't interfere with normal activities     - MODERATE - interferes with normal activities or awakens from sleep     - SEVERE - excruciating pain, unable to do any normal activities         Moderate -severe 7-8, at its worse, 2-3 at time of triage  5. RECURRENT SYMPTOM: Have you ever had headaches before? If Yes, ask: When was the last time? and What happened that time?       Last HA - week ago, relieved with tylenol and rest  6. CAUSE: What do you think is causing the headache?      Pre-eclampsia?  7. MIGRAINE: Have you been diagnosed with migraine headaches? If Yes, ask: Is this headache similar?       No -   8. HEAD INJURY: Has there been any recent injury to the head?       Denies injuries  9. OTHER SYMPTOMS: Do you have any other symptoms? (e.g., abdomen pain, blurred vision, fever, stiff neck; swelling of hands, face, or feet)      Seeing spots, abdominal pain last night - went away after about , swelling of ankles, had swelling of hands yesterday  10. PREGNANCY: How many weeks pregnant are you?        34  11. EDD: What date are you expecting to deliver?        March 12.    Protocols used: PREGNANCY - HEADACHE-A-[place]

## 2021-09-15 NOTE — Telephone Encounter
Pt LVM - pt reports she is 34 wks preg, having an on and off migraine for the last day,  131/84, feet are swelling.

## 2021-09-15 NOTE — Unmapped
Thank you for visiting our triage area. Please keep the discharge paperwork that your nurse and doctor went over with you in a safe place in case you need to look over it again. Please do not hesitate to call with any further questions.     Labor & Delivery nurse line: [phone]    OBGYN: [phone]    Ultrasound: [phone]    We are pleased to offer childbirth preparation classes and tours of our unit at no cost. Please call to register.     Class registration: [phone]

## 2021-09-16 ENCOUNTER — Encounter: Admit: 2021-09-16 | Discharge: 2021-09-16 | Payer: No Typology Code available for payment source

## 2021-09-16 ENCOUNTER — Ambulatory Visit: Admit: 2021-09-16 | Discharge: 2021-09-16 | Payer: No Typology Code available for payment source

## 2021-09-16 DIAGNOSIS — O09513 Supervision of elderly primigravida, third trimester: Secondary | ICD-10-CM

## 2021-09-16 DIAGNOSIS — O99213 Obesity complicating pregnancy, third trimester: Principal | ICD-10-CM

## 2021-09-17 NOTE — Progress Notes
Prenatal Follow Up:    Subjective:  36 y.o. y/o G1P0 @ 30w3dby Patient's last menstrual period was 01/27/2020 (exact date). c/w LMP and 1st Trimester UKorea with Estimated Date of Delivery: 10/26/21.    Today patient reports feeling well. Went to triage for HA and elevated BP - negative for pre-E. Had growth UKoreayesterday.    -VB, -LOF, -Ctx, +FM    Objective:   Vitals:    09/18/21 1440   BP: 110/69   Pulse: 82   Resp: 16   SpO2: 100%       See flowsheet    Prenatal labs Lab Results   Component Value Date    ABORHD A NEG 08/14/2021    ABORHD A NEG 08/14/2021    ABSCRN NEG 08/14/2021    RUBELLA  05/22/2021     EQUIVOCAL, PLEASE SUBMIT ANOTHER SPECIMEN IN 4-6 WEEKS    HEPBSAG NONREACTIVE 02/24/2021    HIV12AGABSCN NONREACTIVE 02/24/2021    SYPHILISABTO NEG 08/29/2021    CHLTRACPCR NEG 02/24/2021    NGONORPCR NEG 02/24/2021      No results found for this or any previous visit.                           28 week Labs CBC repeated  Syphilis abx, repeated  HIV N/A  Ghon/Chlam N/A   GTT (24-28wga) Glucose, 1 Hr   Date Value Ref Range Status   08/01/2021 116 MG/DL Final        Flu vaccine Administered    Tdap vaccine (27-36wga) Administered    Rhogam (28wga) Administered   GBS(36-37+6wga) No results found for: BSTREP    Fetal Position vertex   MMR vaccine(Postpartum) To be administered post-partum     Assessment and Plan:   SBritenyis a 36y.o. G1P0 at 323w3dy LMP and 1st Trimester USKoreaere for follow-up visit.    -Complications: AMA, BMI  -Plan for: breast feeding, wife Karen Barr room, na for contraception, epidural for pain control  -Primary physician: Karen Emeraldhysician: Karen Barr-Return precautions and anticipatory guidance given.   -Induction at 39 weeks    No pregnancy checklist tasks were completed during this visit, and no tasks are pending completion.          Karen LeydenMD  Family Medicine

## 2021-09-18 ENCOUNTER — Ambulatory Visit: Admit: 2021-09-18 | Discharge: 2021-09-18 | Payer: No Typology Code available for payment source

## 2021-09-18 ENCOUNTER — Encounter: Admit: 2021-09-18 | Discharge: 2021-09-18 | Payer: No Typology Code available for payment source

## 2021-09-18 DIAGNOSIS — Z3403 Encounter for supervision of normal first pregnancy, third trimester: Secondary | ICD-10-CM

## 2021-09-18 DIAGNOSIS — F32A Depression: Secondary | ICD-10-CM

## 2021-09-18 DIAGNOSIS — K921 Melena: Secondary | ICD-10-CM
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
## 2021-09-18 NOTE — Patient Instructions
Hypnobirthing is an option for managing discomfort/labor

## 2021-10-02 ENCOUNTER — Ambulatory Visit: Admit: 2021-10-02 | Discharge: 2021-10-02 | Payer: No Typology Code available for payment source

## 2021-10-02 ENCOUNTER — Encounter: Admit: 2021-10-02 | Discharge: 2021-10-02 | Payer: No Typology Code available for payment source

## 2021-10-02 DIAGNOSIS — Z3A36 36 weeks gestation of pregnancy: Secondary | ICD-10-CM

## 2021-10-02 DIAGNOSIS — F32A Depression: Secondary | ICD-10-CM

## 2021-10-02 DIAGNOSIS — K921 Melena: Secondary | ICD-10-CM

## 2021-10-09 ENCOUNTER — Encounter: Admit: 2021-10-09 | Discharge: 2021-10-09 | Payer: No Typology Code available for payment source

## 2021-10-09 ENCOUNTER — Ambulatory Visit: Admit: 2021-10-09 | Discharge: 2021-10-09 | Payer: No Typology Code available for payment source

## 2021-10-09 DIAGNOSIS — F32A Depression: Secondary | ICD-10-CM

## 2021-10-09 DIAGNOSIS — K921 Melena: Secondary | ICD-10-CM

## 2021-10-15 NOTE — Progress Notes
Encounter Date: 10/16/2021   Patient Name: Karen Barr   Date of Birth: 1986/07/11   MRN: [id number]   Encounter Provider Lurlean Leyden, MD   Primary Care Physician:  Lurlean Leyden, MD (General)   EDD: Estimated Date of Delivery: 10/26/21        Subjective     Here for continued prenatal care.   Vaginal bleeding?  No  Loss of fluid? No  Contractions? Yes  Feeling fetal movement? Yes    Current concerns: none    Objective   There were no vitals filed for this visit.    Gen: alert, in no distress, affect appropriate.   HEENT: NCAT  Abd: soft, non-tender  Extremities:  no edema    FHT by doppler: 151     SVE (if indicated): closed/soft/thick/high    No results found for: URINERESULTS     Assessment/Plan     Karen Barr is a 36 y.o. G1P0 at [redacted]w[redacted]d    Complications: Yes  AMA, maternal BMI, conceived via IUI   Third trimester labs reviewed. normal   Fetal position: cephalic   TDaP received? Yes   GBS reviewed: negative     Discussed:    When to go to the hospital.    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

## 2021-10-16 ENCOUNTER — Encounter: Admit: 2021-10-16 | Discharge: 2021-10-16 | Payer: No Typology Code available for payment source

## 2021-10-16 ENCOUNTER — Ambulatory Visit: Admit: 2021-10-16 | Discharge: 2021-10-16 | Payer: No Typology Code available for payment source

## 2021-10-16 DIAGNOSIS — F32A Depression: Secondary | ICD-10-CM

## 2021-10-16 DIAGNOSIS — Z3403 Encounter for supervision of normal first pregnancy, third trimester: Secondary | ICD-10-CM

## 2021-10-16 DIAGNOSIS — K921 Melena: Secondary | ICD-10-CM

## 2021-10-20 ENCOUNTER — Encounter: Admit: 2021-10-20 | Discharge: 2021-10-20 | Payer: No Typology Code available for payment source

## 2021-10-20 NOTE — Telephone Encounter
Pc to pt (x2), LVM newborn will need appt ASAp with [facility] or peds for newborn check. [facility] number provided.     My Chart message sent.

## 2021-10-20 NOTE — Telephone Encounter
Pt. requesting a call back regarding pregnancy. States she had her baby on Saturday in [place], [place] and transferred to [facility] [address] [address] and being released today. Requesting a call back to discuss and give an update. LVM [id number]

## 2021-10-21 ENCOUNTER — Encounter: Admit: 2021-10-21 | Discharge: 2021-10-21 | Payer: No Typology Code available for payment source

## 2021-10-23 ENCOUNTER — Encounter: Admit: 2021-10-23 | Discharge: 2021-10-23 | Payer: No Typology Code available for payment source

## 2021-10-30 ENCOUNTER — Encounter: Admit: 2021-10-30 | Discharge: 2021-10-30 | Payer: No Typology Code available for payment source

## 2021-11-07 ENCOUNTER — Encounter: Admit: 2021-11-07 | Discharge: 2021-11-07 | Payer: No Typology Code available for payment source

## 2021-11-12 ENCOUNTER — Encounter: Admit: 2021-11-12 | Discharge: 2021-11-12 | Payer: No Typology Code available for payment source

## 2021-11-12 DIAGNOSIS — F331 Major depressive disorder, recurrent, moderate: Secondary | ICD-10-CM

## 2021-11-12 MED ORDER — SERTRALINE 25 MG PO TAB
ORAL_TABLET | 3 refills | Status: AC
Start: 2021-11-12 — End: ?

## 2021-11-12 NOTE — Telephone Encounter
Rx refilled per protocol.

## 2021-11-27 ENCOUNTER — Encounter: Admit: 2021-11-27 | Discharge: 2021-11-27 | Payer: No Typology Code available for payment source

## 2021-11-27 ENCOUNTER — Ambulatory Visit: Admit: 2021-11-27 | Discharge: 2021-11-27 | Payer: No Typology Code available for payment source

## 2021-11-27 DIAGNOSIS — K921 Melena: Secondary | ICD-10-CM

## 2021-11-27 DIAGNOSIS — F32A Depression: Secondary | ICD-10-CM

## 2021-11-27 NOTE — Progress Notes
Subjective:      36 y.o. y/o G1P1  She presents 5 weeks postpartum following a primary Cesarean section at [facility] .     Mom:   Postpartum course has been uncomplicated.     Bleeding no bleeding.     Postpartum depression screening: normal.  Contraception plans: n/a    Baby:  Feeding : Baby is feeding well, taking breast and formula  Concerns with breastfeeding : No     Prenatal care provider: Dr. Pilar Plate  Antepartum complications:  AMA    ROS:   Constitutional: Denies fever, chills, decreased appetite  Neuro: Denies HA, lightheadedness, dizziness.  Eyes: Denies vision changes  Respiratory: Denies shortness of breath, cough, wheezing  Cardiovascular: Denies chest pain, leg swelling  GI: Denies abdominal pain, nausea, vomiting, constipation, diarrhea, blood in stool  GU: Denies vaginal bleeding, loss of fluids, contractions, fetal movement   Genitourinary: Denies hematuria, dysuria, frequency, urgency    MSK: Denies arthralgia, myalgias, back pain, gait disturbance.    Objective:   BP 108/74 (BP Source: Arm, Left Upper, Patient Position: Sitting)  - Pulse 60  - Temp 36.4 ?C (97.6 ?F) (Skin)  - Ht 176 cm (5' 9.29)  - LMP 01/27/2020 (Exact Date)  - SpO2 100%  - Breastfeeding Yes  - BMI 38.66 kg/m?     General: well-developed, well-nourished, and NAD   Head: Normocephalic and atraumatic.   Eyes: Conjunctivae are normal. Sclera anicteric.   CV: RRR.  No murmurs/rubs/gallops.   Pulm: Effort normal.  CTAB.  No wheezes/rales/ronchi.   Abdomen: Soft, NTTP.  Normal bowel sounds. Well healing surgical scar, nodule noted inferior to incision left side midline nontender  Neurological: A&Ox3.  CNIII-XII grossly intact.  No focal deficits.   Extremities: No edema.  Normal pulses intact bilaterally.       Psychiatric: Normal thought content, speech, affect, and mood.    Assessment and Plan:   Karen Barr is a 36 y.o. G1P0 here for post-partum f/u visit.     - Contraception: n/a  - No signs of post-partum depression  - Continue with prenatal vitamins  - Ok to resume physical and sexual activity  - Breastfeeding - Patient educated re: expected course    - All questions answered            [name], MD

## 2022-01-01 ENCOUNTER — Encounter: Admit: 2022-01-01 | Discharge: 2022-01-01 | Payer: No Typology Code available for payment source

## 2022-01-01 ENCOUNTER — Ambulatory Visit: Admit: 2022-01-01 | Discharge: 2022-01-01 | Payer: No Typology Code available for payment source

## 2022-01-01 DIAGNOSIS — M5030 Other cervical disc degeneration, unspecified cervical region: Secondary | ICD-10-CM

## 2022-01-01 DIAGNOSIS — K921 Melena: Secondary | ICD-10-CM

## 2022-01-01 DIAGNOSIS — M62838 Other muscle spasm: Secondary | ICD-10-CM

## 2022-01-01 DIAGNOSIS — F32A Depression: Secondary | ICD-10-CM

## 2022-01-01 DIAGNOSIS — M5412 Radiculopathy, cervical region: Secondary | ICD-10-CM

## 2022-01-01 NOTE — Progress Notes
[facility] Inter[facility]  Follow Up Visit  Subjective     Chief Complaint:   Chief Complaint   Patient presents with   ? Neck - Pain       HPI: Karen Barr is a 36 y.o. female who  has a past medical history of Blood in stool and Depression. who presents for evaluation.    She had her baby, 70 month old boy.     She was last seen in 06/2021 and had been doing great since CESI in 03/2021.     She notes residual numbness/tingling in the left 2nd/3rd digit and down the arm.     She is having pain shooting into those fingers.     Some neck pain with this radicular LUE pain.     Very rare RUE symptoms.     +muscle stiffness/tightness.     She is breastfeeding.          PRIOR MEDICATIONS:   Effective      Ineffective  Medrol dose pack was not beneficial  Acetaminophen    Unable to tolerate      Never  Gabapentin   Lyrica  Ami/Nortriptyline  Cymbalta  Tizanidine    PRIOR INTERVENTIONS:  No spine surgery  Effective  CESI  Physician-ordered PT  Chiropractic (little)    Ineffective          [name] denies any recent fevers, chills, infection, bowel or bladder incontinence, saddle anesthesia, bleeding issues, or recent anticoagulant. Being treated for H. pyori.     Past Medical History:  Medical History:   Diagnosis Date   ? Blood in stool    ? Depression        Family History:  Family History   Problem Relation Age of Onset   ? Diabetes Mother    ? High Cholesterol Mother    ? Hypertension Mother    ? Miscarriages Mother    ? Diabetes Sister    ? Cancer Maternal Grandmother    ? High Cholesterol Maternal Grandmother        Social History:  Lives in [place] [address]    Social History     Socioeconomic History   ? Marital status: Married     Spouse name: Irving Burton   ? Number of children: 0   Occupational History   ? Occupation: Arboriculturist   Tobacco Use   ? Smoking status: Never   ? Smokeless tobacco: Never   Vaping Use   ? Vaping Use: Never used   Substance and Sexual Activity   ? Alcohol use: Not Currently   ? Drug use: No   ? Sexual activity: Yes     Partners: Female       Allergies:  No Known Allergies    Medications:    Current Outpatient Medications:   ?  acetaminophen (TYLENOL EXTRA STRENGTH) 500 mg tablet, Take two tablets by mouth every 8 hours as needed for Pain. Max of 4,000 mg of acetaminophen in 24 hours., Disp: , Rfl:   ?  AMITR/GABAPEN/EMU OIL 11/18/08% CREAM (COMPOUND), Apply  topically to affected area every 8 hours as needed., Disp: 50 g, Rfl: 1  ?  aspirin EC (ASPIR-LOW) 81 mg tablet, Take one tablet by mouth daily. Take with food., Disp: 90 tablet, Rfl: 3  ?  ferrous sulfate (IRON) 325 mg (65 mg iron) tablet, Take one tablet by mouth every 48 hours. Take on an empty stomach at least  1 hour before or 2 hours after food., Disp: 90 tablet, Rfl: 0  ?  folic acid (FOLVITE) 1 mg tablet, Take one tablet by mouth daily., Disp: 90 tablet, Rfl: 1  ?  mesalamine (CANASA) 1,000 mg rectal suppository, Insert or Apply one suppository to rectal area as directed at bedtime daily., Disp: 30 suppository, Rfl: 5  ?  sertraline (ZOLOFT) 25 mg tablet, TAKE 1 TABLET BY MOUTH EVERY DAY, Disp: 90 tablet, Rfl: 3  ?  vitamins, prenatal w/iron & folate 65/1 mg tab, Take one tablet by mouth daily., Disp: , Rfl:     Physical examination:   BP 107/74 (BP Source: Arm, Left Upper, Patient Position: Sitting)  - Pulse 69  - Temp 36.3 ?C (97.3 ?F) (Temporal)  - Ht 176 cm (5' 9.29)  - Wt 108.9 kg (240 lb) Comment: per pt - LMP 01/27/2020 (Exact Date)  - SpO2 98%  - Breastfeeding Yes  - BMI 35.15 kg/m?   Pain Score: Zero    General: The patient is a well-developed, well nourished 36 y.o. female in no acute distress.   HEENT: Head is normocephalic and atraumatic.   Cardiac: Based on palpation, pulse appears to be regular rate and rhythm.   Pulmonary: The patient has unlabored respirations and bilateral symmetric chest excursion.   Abdomen: Soft, nontender, and nondistended.   Extremities: No clubbing, cyanosis, or edema.     Neurologic:   The patient is alert and oriented times 3.     Musculoskeletal:     C-Spine   There is mild low cervical paraspinal tenderness. Paraspinal muscle tone is increased.  ROM with flexion, extension, rotation, and lateral bending is intact but can result in some LUE radicular pain.   Strength is equal and adequate bilaterally in the flexors and extensors of the bilateral upper extremities.   Hoffman's is negative bilaterally.      MRI C-Spine Results:    Results for orders placed during the hospital encounter of 04/06/21    MRI C-SPINE WO CONTRAST    Narrative  EXAM: MRI C-SPINE    HISTORY:    Neck/back pain radiating down the left upper extremity resulting in weakness and numbness. Approximately [redacted] weeks pregnant.    Technique: Multiple sagittal and axial MR sequences were obtained of the cervical spine.    Comparison: None    FINDINGS:    Slight reversal of the cervical lordosis centered at C5-C6. The vertebral body heights are maintained. Diffuse marrow T1 hypointensity. The cervical cord is normal in signal.    At C5-C6: Eccentric right-sided posterior disc osteophyte complex and ligamentum flavum thickening results in moderate central spinal stenosis.    At C6-C7: Left posterior disc osteophyte complex and disc herniation and ligamentum flavum thickening resulting in severe central spinal stenosis with cord compression, although, no obvious cord edema.    Degenerative disc disease and uncovertebral arthrosis results in multilevel neural foraminal stenosis, most pronounced and of at least moderate degree on the left at C6-C7 and at least mild degree on the right at C5-C6 and C6-C7, although, evaluation of the foramina is limited due to patient motion on axial imaging. Tiny perineural root sleeve cysts cysts on the left at C5-6 and C6-C7.    The paraspinous soft tissues are unremarkable.    Impression  1.  Left posterior disc osteophyte complex and disc herniation at C6-C7 with severe central spinal stenosis and cord deformation, though, no obvious cord edema.  2.  Moderate degenerative central spinal stenosis (  eccentric to the right at C5-C6).  3.  Multilevel degenerative neural foraminal stenosis, most pronounced of at least moderate degree on the left at C6-C7, although, evaluation of the foramina is limited due to patient motion on axial imaging.  4.  T1 hypointense marrow signal which may be physiologic, though, marrow hyperplasia could appear similar.      Approved by Gwinda Passe, MD on 04/06/2021 12:59 PM    By my electronic signature, I attest that I have personally reviewed the images for this examination and formulated the interpretations and opinions expressed in this report      Finalized by Desma Maxim, M.D. on 04/06/2021 1:47 PM. Dictated by Gwinda Passe, MD on 04/06/2021 12:32 PM.          Last Cr and LFT's:  Creatinine   Date Value Ref Range Status   09/15/2021 0.53 0.4 - 1.00 MG/DL Final     AST (SGOT)   Date Value Ref Range Status   09/15/2021 36 7 - 40 U/L Final     ALT (SGPT)   Date Value Ref Range Status   09/15/2021 11 7 - 56 U/L Final     Alk Phosphatase   Date Value Ref Range Status   09/15/2021 73 25 - 110 U/L Final     Total Bilirubin   Date Value Ref Range Status   09/15/2021 0.4 0.3 - 1.2 MG/DL Final          Assessment:    Margarit Minshall is a 36 y.o. female who  has a past medical history of Blood in stool and Depression. who presents for evaluation of pain.    The pain complaints are most likely due to:    1. Cervical radiculopathy        2. DDD (degenerative disc disease), cervical        3. Muscle spasm              Plan:  1. Plan for CESI C7-T1 in PROCEDURES.   This patient's clinical history, exam, AND imaging support radiculopathy AND there is a significant impact on quality of life and function AND the pain has been present for at least 4 weeks AND they have failed to improve with noninvasive conservative care.   This patient had at least 50% pain relief for at least 3 months with the last epidural injection.     2. Continue HEP. She completed PT. She still sees a [facility].   3. Avoid heavy lifting.   4. If she fails to improve, will consider surgical evaluation, but she had excellent response to injection in 03/2021.   5. Can take acetaminophen 1g po bid prn. She should discuss all medications with OB/Gyn in light of breastfeeding.   6. Follow up as needed.    Risks/benefits of all pharmacologic and interventional treatments discussed and questions answered.

## 2022-02-10 ENCOUNTER — Encounter: Admit: 2022-02-10 | Discharge: 2022-02-10 | Payer: No Typology Code available for payment source

## 2022-02-10 ENCOUNTER — Ambulatory Visit: Admit: 2022-02-10 | Discharge: 2022-02-10 | Payer: No Typology Code available for payment source

## 2022-02-10 DIAGNOSIS — K921 Melena: Secondary | ICD-10-CM

## 2022-02-10 DIAGNOSIS — M5412 Radiculopathy, cervical region: Secondary | ICD-10-CM

## 2022-02-10 DIAGNOSIS — R52 Pain, unspecified: Secondary | ICD-10-CM

## 2022-02-10 DIAGNOSIS — M5030 Other cervical disc degeneration, unspecified cervical region: Secondary | ICD-10-CM

## 2022-02-10 DIAGNOSIS — F32A Depression: Secondary | ICD-10-CM

## 2022-02-10 MED ORDER — SODIUM CHLORIDE 0.9 % IJ SOLN
3 mL | Freq: Once | INTRAMUSCULAR | 0 refills | Status: CP
Start: 2022-02-10 — End: ?

## 2022-02-10 MED ORDER — IOHEXOL 300 MG IODINE/ML IV SOLN
1 mL | Freq: Once | 0 refills | Status: CP
Start: 2022-02-10 — End: ?

## 2022-02-10 MED ORDER — LIDOCAINE (PF) 10 MG/ML (1 %) IJ SOLN
2 mL | Freq: Once | INTRAMUSCULAR | 0 refills | Status: CP
Start: 2022-02-10 — End: ?

## 2022-02-10 MED ORDER — TRIAMCINOLONE ACETONIDE 40 MG/ML IJ SUSP
80 mg | Freq: Once | EPIDURAL | 0 refills | Status: CP
Start: 2022-02-10 — End: ?

## 2022-02-10 NOTE — Progress Notes
SPINE CENTER  INTERVENTIONAL PAIN PROCEDURE HISTORY AND PHYSICAL    Chief Complaint: Pain    HISTORY OF PRESENT ILLNESS:  Neck pain with UE radicular pain    Medical History:   Diagnosis Date   ? Blood in stool    ? Depression        Surgical History:   Procedure Laterality Date   ? Katrinka Blazing and nephew fast fix 360* ARTHROSCOPY LEFTKNEE WITH PARTIAL LATERAL  MENISCECTOMY Left 03/19/2020    Performed by Talmadge Chad, MD at [facility] OR   ? COLONOSCOPY DIAGNOSTIC WITH SPECIMEN COLLECTION BY BRUSHING/ WASHING - FLEXIBLE N/A 07/08/2020    Performed by Buckles, Vinnie Level, MD at [facility] OR   ? CHOLECYSTECTOMY     ? HX SHOULDER ARTHROSCOPY     ? HX TONSILLECTOMY         family history includes Cancer in her maternal grandmother; Diabetes in her mother and sister; High Cholesterol in her maternal grandmother and mother; Hypertension in her mother; Miscarriages in her mother.    Social History     Socioeconomic History   ? Marital status: Married     Spouse name: Irving Burton   ? Number of children: 0   Occupational History   ? Occupation: Arboriculturist   Tobacco Use   ? Smoking status: Never   ? Smokeless tobacco: Never   Vaping Use   ? Vaping Use: Never used   Substance and Sexual Activity   ? Alcohol use: Not Currently   ? Drug use: No   ? Sexual activity: Yes     Partners: Female       No Known Allergies    There were no vitals filed for this visit.          REVIEW OF SYSTEMS: 10 point ROS obtained and negative      PHYSICAL EXAM:    General: Alert, cooperative, no acute distress.  HEENT: Normocephalic, atraumatic.  Neck: Supple.  Lungs: Unlabored respirations, bilateral and equal chest excursion.  Heart: Regular rate.  Skin: Warm and dry to touch.  Abdomen: Nondistended.  MSK: No deformity.  Neurological: Alert and oriented x3.        IMPRESSION:    1. Cervical radiculopathy    2. DDD (degenerative disc disease), cervical         PLAN: Cervical Epidural Steroid Injection C7-T1    This patient's clinical history, exam, AND imaging support radiculopathy AND there is a significant impact on quality of life and function AND the pain has been present for at least 4 weeks AND they have failed to improve with noninvasive conservative care.     This patient had at least 50% pain relief for at least 3 months with the last epidural injection.

## 2022-02-10 NOTE — Procedures
Attending Surgeon: Evelina Bucy, MD    Anesthesia: Local    Pre-Procedure Diagnosis:   1. Cervical radiculopathy    2. DDD (degenerative disc disease), cervical        Post-Procedure Diagnosis:   1. Cervical radiculopathy    2. DDD (degenerative disc disease), cervical             Epidural Steroid Injection Cervical/Thoracic  Procedure: epidural - interlaminar    Laterality: n/a   on 02/10/2022 2:15 PM  Location: cervical ESI with imaging -  C7-T1      Consent:   Consent obtained: written  Consent given by: patient  Risks discussed: allergic reaction, bleeding, bruising, infection, never damage, no change or worsening in pain, pneumo thorax, reaction to medication, seizure, swelling and weakness  Alternatives discussed: alternative treatment, delayed treatment and no treatment  Discussed with patient the purpose of the treatment/procedure, other ways of treating my condition, including no treatment/ procedure and the risks and benefits of the alternatives. Patient has decided to proceed with treatment/procedure.        Universal Protocol:  Relevant documents: relevant documents present and verified  Site marked: the operative site was marked  Patient identity confirmed: Patient identify confirmed verbally with patient.        Time out: Immediately prior to procedure a time out was called to verify the correct patient, procedure, equipment, support staff and site/side marked as required      Procedures Details:   Indications: pain   Prep: chlorhexidine  Patient position: prone  Estimated Blood Loss: minimal  Specimens: none  Number of Levels: 1  Approach: midline  Guidance: fluoroscopy  Contrast: Procedure confirmed with contrast under live fluoroscopy.  Needle and Epidural Catheter: tuohy  Needle size: 18 G  Injection procedure: Incremental injection and Negative aspiration for blood  Amount Injected:   C7-T1: 4mL  Patient tolerance: Patient tolerated the procedure well with no immediate complications. Pressure was applied, and hemostasis was accomplished.  Outcome: Pain unchanged  Comments:   CERVICAL INTERLAMINAR EPIDURAL STEROID INJECTION    PROCEDURE:  1) C7-T1 interlaminar epidural steroid injection  2) Fluoroscopic needle guidance    REASON FOR PROCEDURE: Cervical radiculopathy, DDD (cervical)    ATTENDING PHYSICIAN: Evelina Bucy, MD    MEDICATIONS INJECTED: 2 mL of triamciniolone (80 mg) and 2 mL of sterile, preservative-free normal saline    LOCAL ANESTHETIC INJECTED: 1 mL of 1% lidocaine    SEDATION MEDICATIONS: None    ESTIMATED BLOOD LOSS: None    SPECIMENS REMOVED: None    COMPLICATIONS: None    TECHNIQUE: Time-out was taken to identify the correct patient, procedure and side prior to starting the procedure. With the patient lying in a prone position with the neck in a slightly  flexed position, the area was prepped and draped in sterile fashion using DuraPrep and a fenestrated drape. The area was determined under fluoroscopic guidance. A 27-gauge, 1.25-inch  needle was used to anesthetize the needle entry site and subcutaneous tissues.     The 18-gauge, 3.5-inch Tuohy needle was advanced through the ligamentum flavum using loss of resistance technique. Once the tip of the needle was thought to be in the desired position, contrast was injected to confirm only epidural spread and no vascular runoff via A-P and lateral  views. The injectate was then injected slowly with intermittent negative aspiration.    The procedure was completed without complications and was tolerated well. The patient was monitored after the procedure. The patient (or  responsible party) was given post-procedure and  discharge instructions to follow at home. The patient was discharged in stable condition.    Evelina Bucy, MD, MBA          Administrations This Visit     iohexoL (OMNIPAQUE-300) 300 mg/mL injection 1 mL     Admin Date  02/10/2022 Action  Given Dose  1 mL Route  SEE ADMIN INSTRUCTIONS Administered By  Leta Baptist, RN lidocaine PF 1% (10 mg/mL) injection 2 mL     Admin Date  02/10/2022 Action  Given Dose  2 mL Route  Injection Administered By  Evelina Bucy, MD          sodium chloride PF 0.9% injection 3 mL     Admin Date  02/10/2022 Action  Given Dose  3 mL Route  Injection Administered By  Evelina Bucy, MD          triamcinolone acetonide (KENALOG-40) injection 80 mg     Admin Date  02/10/2022 Action  Given Dose  80 mg Route  Epidural Administered By  Evelina Bucy, MD              Estimated blood loss: none or minimal  Specimens: none  Patient tolerated the procedure well with no immediate complications. Pressure was applied, and hemostasis was accomplished.

## 2022-02-10 NOTE — Discharge Instructions - Supplementary Instructions
GENERAL POST PROCEDURE INSTRUCTIONS  Physician: _________________________________  Procedure Completed Today:  Joint Injection (hip, knee, shoulder)  Cervical Epidural Steroid Injection  Cervical Transforaminal Steroid Injection  Trigger Point Injection  Caudal Epidural Steroid Injection  Pudendal Nerve Block  Other _____________________ Thoracic Epidural Steroid Injection  Lumbar Epidural Steroid Injection  Lumbar Transforaminal Steroid Injection  Facet Joint Injection  Celiac Nerve Block  Sacrococcygeal  Sacroiliac Joint Injection   Important information following your procedure today:  You may drive today     If you had sedation, you may NOT drive today  Rest at home for the next 6 hours.  You may then begin to resume your normal activities.  DO NOT drive any vehicle, operate any power tools, drink alcohol, make any major decisions, or sign any legal documents for the next 12 hours.  Pain relief may not be immediate. It is possible you may even experience an increase in pain during the first 24-48 hours followed by a gradual decrease of your pain.  Though the procedure is generally safe, and complications are rare, we do ask that you be aware of any of the following:  Any swelling, persistent redness, new bleeding or drainage from the site of the injection.  You should not experience a severe headache.  You should not run a fever over 101oF.  New onset of sharp, severe back and or neck pain.  New onset of upper or lower extremity numbness or weakness.  New difficulty controlling bowel or bladder function after injection.  New shortness of breath.  ** If any of these occur, please call to report this occurrence to a nurse at [phone]. If you are calling after 4:00 p.m. or on weekends or holidays, please call [phone] and ask to have the resident physician on call for the physician paged or go to your local emergency room.  You may experience soreness at the injection site. Ice can be applied at 20-minute intervals for the first 24 hours. The following day you may alternate ice with heat if you are experiencing muscle tightness, otherwise continue with ice. Ice works best at decreasing pain. Avoid application of direct heat, hot showers or hot tubs today.  Avoid strenuous activity today. You many resume your regular activities and exercise tomorrow.  Patients with diabetes may see an elevation in blood sugars for 7-10 days after the injection. It is important to pay close attention to your diet, check your blood sugars daily and report extreme elevations to the physician that manages your diabetes.  Patients taking daily blood thinners can resume their regular dose this evening.  It is important that you take all medications ordered by your pain physician. Taking medications as ordered is an important part of your pain care plan. If you cannot continue the medication plan, please notify the physician.    Possible side effects to steroids that may occur:  Flushing or redness of the face  Irritability  Fluid retention  Change in women's menses  Minor headache    If you are unable to keep your upcoming appointment, please notify the [facility] scheduler at [phone] at least 24 hours in advance. If you have questions for the surgery center, call [facility] at [phone].

## 2022-03-06 ENCOUNTER — Encounter: Admit: 2022-03-06 | Discharge: 2022-03-06 | Payer: No Typology Code available for payment source

## 2022-03-06 DIAGNOSIS — K51211 Ulcerative (chronic) proctitis with rectal bleeding: Secondary | ICD-10-CM

## 2022-03-06 MED ORDER — SULFASALAZINE 500 MG PO TAB
1000 mg | ORAL_TABLET | Freq: Three times a day (TID) | ORAL | 0 refills | Status: AC
Start: 2022-03-06 — End: ?

## 2022-04-29 ENCOUNTER — Encounter: Admit: 2022-04-29 | Discharge: 2022-04-29 | Payer: No Typology Code available for payment source

## 2022-04-29 DIAGNOSIS — K51211 Ulcerative (chronic) proctitis with rectal bleeding: Secondary | ICD-10-CM

## 2022-04-29 MED ORDER — MESALAMINE 1,000 MG RE SUPP
1000 mg | Freq: Every evening | RECTAL | 4 refills
Start: 2022-04-29 — End: ?

## 2022-04-29 NOTE — Telephone Encounter
Routing to Dr.Yedlinsky for approval.

## 2022-07-01 ENCOUNTER — Encounter: Admit: 2022-07-01 | Discharge: 2022-07-01 | Payer: No Typology Code available for payment source

## 2022-07-21 ENCOUNTER — Encounter: Admit: 2022-07-21 | Discharge: 2022-07-21 | Payer: No Typology Code available for payment source

## 2022-07-21 DIAGNOSIS — S6991XA Unspecified injury of right wrist, hand and finger(s), initial encounter: Secondary | ICD-10-CM

## 2022-07-21 NOTE — Telephone Encounter
Pt notified of xray ordered 

## 2022-07-21 NOTE — Telephone Encounter
Pt reports she hurt her R thumb on Thanksgiving, she is complaining of limitation of movement of tip of thumb. She is still experiencing swelling and slight bruising. She now reports that end of thumb is fine its not the base that is causing her pain. Pt would like to know if she can have imaging completed or if she needs to be evaluated prior to imaging.

## 2022-07-21 NOTE — Telephone Encounter
I ordered x-rays so she can get them done and follow up in clinic if not improved

## 2022-07-23 ENCOUNTER — Encounter: Admit: 2022-07-23 | Discharge: 2022-07-23 | Payer: No Typology Code available for payment source

## 2022-07-23 ENCOUNTER — Ambulatory Visit: Admit: 2022-07-23 | Discharge: 2022-07-23 | Payer: No Typology Code available for payment source

## 2022-07-23 DIAGNOSIS — S6991XA Unspecified injury of right wrist, hand and finger(s), initial encounter: Secondary | ICD-10-CM

## 2022-07-28 ENCOUNTER — Encounter: Admit: 2022-07-28 | Discharge: 2022-07-28 | Payer: No Typology Code available for payment source

## 2022-07-28 ENCOUNTER — Ambulatory Visit: Admit: 2022-07-28 | Discharge: 2022-07-28 | Payer: No Typology Code available for payment source

## 2022-07-28 DIAGNOSIS — K921 Melena: Secondary | ICD-10-CM

## 2022-07-28 DIAGNOSIS — F32A Depression: Secondary | ICD-10-CM

## 2022-07-28 DIAGNOSIS — S63641A Sprain of metacarpophalangeal joint of right thumb, initial encounter: Secondary | ICD-10-CM

## 2022-07-28 NOTE — Progress Notes
Encounter Date: 07/28/2022   Encounter Provider Karie Mainland, MD   Primary Care Physician:  [name], MD (General)     HISTORY OF PRESENT ILLNESS:    Karen Barr is a 36 y.o. female seen in Sports Medicine clinic for:    Chief Complaint   Patient presents with    Pain     Right thumb pain   XR done 07/23/22       Acute issue for patient that started on Thanksgiving day when she was playing a game of tag.  She made a movement without falling and landing on her thumb she is unsure if she had valgus or varus stress to the thumb, but immediately had pain.  She went had x-rays completed that were negative for acute fracture or dislocation.  She localizes the pain today more distally over the first MCP and extensors of the first digit.  That pain wraps around to the volar aspect of the thumb.  She denies any sensation changes down into the thumb.  She denies significant wrist pain, but does report there is some radiation up into the wrist joint.  Has been utilizing a brace which has helped a little bit.  Has not utilized oral NSAIDs continuously.      ASSESSMENT AND PLAN:    Karen Barr is a 36 y.o. female seen today in clinic for:     1. Sprain of metacarpophalangeal (MCP) joint of right thumb, initial encounter          Acute thumb sprain; limited diagnostic ultrasound in clinic today without significant tenosynovitis findings with intact extensor mechanisms.  Discussed next steps in management proceed with thumb spica bracing for 2 weeks as well as scheduled Aleve.  She will utilize hand HEP.  If no improvement, can consider MRI for further evaluation.    Return precautions discussed, questions sought and answered. Patient expressed understanding and agreed with treatment plan.     Will plan to see back as needed for pain.      Staffed with attending, Dr. Lurlean Nanny, MD  Primary Care Sports Medicine Fellow  [facility] SUBJECTIVE CONTINUED:    Review of Systems   Musculoskeletal: Positive for arthralgias.   Neurological: Negative for weakness and numbness.   All other systems reviewed and are negative.      OBJECTIVE:  Nursing note and vitals reviewed.    Vitals:    07/28/22 0910   BP: 128/71   BP Source: Arm, Left Upper   Pulse: 71   SpO2: 99%   PainSc: Three   Weight: 114.6 kg (252 lb 9.6 oz)   Height: 176 cm (5' 9.29)        acetaminophen (TYLENOL EXTRA STRENGTH) 500 mg tablet Take two tablets by mouth every 8 hours as needed for Pain. Max of 4,000 mg of acetaminophen in 24 hours.    AMITR/GABAPEN/EMU OIL 11/18/08% CREAM (COMPOUND) Apply  topically to affected area every 8 hours as needed.    aspirin EC (ASPIR-LOW) 81 mg tablet Take one tablet by mouth daily. Take with food.    ferrous sulfate (IRON) 325 mg (65 mg iron) tablet Take one tablet by mouth every 48 hours. Take on an empty stomach at least 1 hour before or 2 hours after food.    folic acid (FOLVITE) 1 mg tablet Take one tablet by mouth daily.    mesalamine (CANASA) 1,000 mg rectal suppository INSERT OR  APPLY ONE SUPPOSITORY TO RECTAL AREA AS DIRECTED AT BEDTIME DAILY.    sertraline (ZOLOFT) 25 mg tablet TAKE 1 TABLET BY MOUTH EVERY DAY    sulfaSALAzine (AZULFIDINE) 500 mg tablet Take two tablets by mouth three times daily. Take with food.  Indications: ulcerative colitis, an inflammatory condition of the intestines    vitamins, prenatal w/iron & folate 65/1 mg tab Take one tablet by mouth daily.         PHYSICAL EXAM:  Constitutional: appears well-developed and well-nourished in no acute distress  Head: Normocephalic and atraumatic.   Pulmonary/Chest: Effort normal. No respiratory distress.     Right Hand Exam   Right hand exam is normal.      Left Hand Exam     Tenderness   Left hand tenderness location: tender over 1st MCP joint line.     Range of Motion   The patient has normal left wrist ROM.  Wrist   Extension:  normal   Flexion:  normal Pronation:  normal   Supination:  normal   Hand   MP Thumb: normal   DIP Thumb: normal     Muscle Strength   Wrist extension: 5/5   Wrist flexion: 5/5   Grip:  5/5     Tests   Phalen?s Sign: negative  Tinel's sign (median nerve): negative  Finkelstein's test: negative    Other   Erythema: absent  Sensation: normal  Pulse: present                Karie Mainland, MD    Parts of this clinic note were completed using a dragon voice recognition software system. Please excuse any misspellings or grammatical errors.  If you need clarification please contact my office.

## 2022-08-20 ENCOUNTER — Ambulatory Visit: Admit: 2022-08-20 | Discharge: 2022-08-20 | Payer: No Typology Code available for payment source

## 2022-08-20 ENCOUNTER — Encounter: Admit: 2022-08-20 | Discharge: 2022-08-20 | Payer: No Typology Code available for payment source

## 2022-08-20 DIAGNOSIS — A048 Other specified bacterial intestinal infections: Secondary | ICD-10-CM

## 2022-08-20 NOTE — Progress Notes
Administered covid spikevax and flu vaccine in RD. VIS and consent given. Pt tolerated well.

## 2022-11-30 ENCOUNTER — Encounter: Admit: 2022-11-30 | Discharge: 2022-11-30 | Payer: Private Health Insurance - Indemnity

## 2022-12-02 ENCOUNTER — Encounter: Admit: 2022-12-02 | Discharge: 2022-12-02 | Payer: Private Health Insurance - Indemnity

## 2022-12-02 DIAGNOSIS — F331 Major depressive disorder, recurrent, moderate: Secondary | ICD-10-CM

## 2022-12-02 MED ORDER — SERTRALINE 25 MG PO TAB
25 mg | ORAL_TABLET | Freq: Every day | ORAL | 0 refills | Status: AC
Start: 2022-12-02 — End: ?

## 2022-12-02 NOTE — Telephone Encounter
sertraline (ZOLOFT) 25 mg tablet         Sig: Take one tablet by mouth daily.    Disp: 90 tablet    Refills: 3    Start: 12/02/2022    Class: Normal    For: Moderate episode of recurrent major depressive disorder (HCC)    Last ordered: 1 year ago (11/12/2021) by [name], MD    Psychiatry: Antidepressants - SSRI, TCAs, Serotonin Modulators & SNeRIs Passed04/17/2024 04:01 PM   Protocol Details Depression Screening completed within the last 6 months    Valid encounter within last 6 months    Patient is < 22 years old. Beers Criteria: Use with caution in patients 65+

## 2023-01-04 ENCOUNTER — Encounter: Admit: 2023-01-04 | Discharge: 2023-01-04 | Payer: Private Health Insurance - Indemnity

## 2023-01-04 ENCOUNTER — Ambulatory Visit: Admit: 2023-01-04 | Discharge: 2023-01-05 | Payer: Private Health Insurance - Indemnity

## 2023-01-04 DIAGNOSIS — F32A Depression: Secondary | ICD-10-CM

## 2023-01-04 DIAGNOSIS — K921 Melena: Secondary | ICD-10-CM

## 2023-01-04 DIAGNOSIS — J0110 Acute frontal sinusitis, unspecified: Secondary | ICD-10-CM

## 2023-01-04 MED ORDER — AMOXICILLIN-POT CLAVULANATE 875-125 MG PO TAB
1 | ORAL_TABLET | Freq: Two times a day (BID) | ORAL | 0 refills | 7.00000 days | Status: AC
Start: 2023-01-04 — End: ?

## 2023-01-04 MED ORDER — BENZONATATE 100 MG PO CAP
100 mg | ORAL_CAPSULE | ORAL | 0 refills | 9.00000 days | Status: AC
Start: 2023-01-04 — End: ?

## 2023-01-04 MED ORDER — ALBUTEROL SULFATE 90 MCG/ACTUATION IN HFAA
2 | RESPIRATORY_TRACT | 0 refills | Status: AC | PRN
Start: 2023-01-04 — End: ?

## 2023-01-04 NOTE — Progress Notes
Date of Service: 01/04/2023    Karen Barr is a 37 y.o. female.  DOB: 03/01/1986  MRN: [id number]     Subjective:             History of Present Illness  Chief Complaint   Patient presents with    Generalized Body Aches     X1 week. Patient was exposed to adenovirus on friday   Patient reports to clinic with a 1 week history of bodyaches sinus pain and pressure and productive or wet cough.  There is no shortness of breath.  Patient initially had myalgias as well.  Symptoms were improving until Saturday they came back full-blown again.  Karen Barr especially has the sinus pain and pressure.       Review of Systems   Constitutional:  Negative for fever.   HENT:  Positive for congestion, postnasal drip, rhinorrhea, sinus pressure and sinus pain.    Eyes: Negative.    Respiratory:  Positive for cough. Negative for shortness of breath.         Cough is mild.   Gastrointestinal: Negative.    Musculoskeletal: Negative.    Skin: Negative.          Objective:          albuterol sulfate (PROAIR HFA) 90 mcg/actuation HFA aerosol inhaler Inhale two puffs by mouth into the lungs every 6 hours as needed.    amoxicillin-potassium clavulanate (AUGMENTIN) 875/125 mg tablet Take one tablet by mouth twice daily with meals.    benzonatate (TESSALON PERLES) 100 mg capsule Take one capsule by mouth every 8 hours.     Vitals:    01/04/23 1023   BP: 118/87   BP Source: Arm, Right Upper   Pulse: 94   Temp: 36.8 ?C (98.2 ?F)   Resp: 18   SpO2: 98%   TempSrc: Temporal   Weight: 112.9 kg (249 lb)   Height: 175.3 cm (5' 9)     Body mass index is 36.77 kg/m?Marland Kitchen     Physical Exam  Vitals reviewed.   Constitutional:       General: Karen Barr is not in acute distress.     Appearance: Normal appearance. Karen Barr is not ill-appearing.   HENT:      Head: Normocephalic and atraumatic.      Right Ear: Tympanic membrane, ear canal and external ear normal.      Left Ear: Tympanic membrane, ear canal and external ear normal.      Nose: Congestion present. Comments: Positive frontal sinus pain.     Mouth/Throat:      Mouth: Mucous membranes are moist.      Pharynx: Oropharynx is clear.   Eyes:      Conjunctiva/sclera: Conjunctivae normal.   Cardiovascular:      Rate and Rhythm: Normal rate and regular rhythm.      Pulses: Normal pulses.      Heart sounds: Normal heart sounds.   Pulmonary:      Effort: Pulmonary effort is normal. No respiratory distress.      Breath sounds: Normal breath sounds. No stridor. No wheezing, rhonchi or rales.   Abdominal:      General: Bowel sounds are normal.      Palpations: Abdomen is soft.   Musculoskeletal:      Cervical back: Normal range of motion and neck supple.   Skin:     General: Skin is warm and dry.   Neurological:      Mental Status: Karen Barr is  alert.   Psychiatric:         Mood and Affect: Mood normal.              Assessment and Plan:  [name] was seen today for generalized body aches.    Diagnoses and all orders for this visit:    Acute non-recurrent frontal sinusitis    Other orders  -     amoxicillin-potassium clavulanate (AUGMENTIN) 875/125 mg tablet; Take one tablet by mouth twice daily with meals.  -     benzonatate (TESSALON PERLES) 100 mg capsule; Take one capsule by mouth every 8 hours.  -     albuterol sulfate (PROAIR HFA) 90 mcg/actuation HFA aerosol inhaler; Inhale two puffs by mouth into the lungs every 6 hours as needed.    Increase fluids, Mucinex and antipyretics as supportive care.  Follow-up in clinic or with PCP if symptoms persist

## 2023-01-20 ENCOUNTER — Encounter: Admit: 2023-01-20 | Discharge: 2023-01-20 | Payer: Private Health Insurance - Indemnity

## 2023-01-20 ENCOUNTER — Ambulatory Visit: Admit: 2023-01-20 | Discharge: 2023-01-21 | Payer: Private Health Insurance - Indemnity

## 2023-01-20 DIAGNOSIS — F32A Depression: Secondary | ICD-10-CM

## 2023-01-20 DIAGNOSIS — M62838 Other muscle spasm: Secondary | ICD-10-CM

## 2023-01-20 DIAGNOSIS — K921 Melena: Secondary | ICD-10-CM

## 2023-01-20 DIAGNOSIS — M5030 Other cervical disc degeneration, unspecified cervical region: Secondary | ICD-10-CM

## 2023-01-20 DIAGNOSIS — M5412 Radiculopathy, cervical region: Secondary | ICD-10-CM

## 2023-01-20 MED ORDER — TIZANIDINE 2 MG PO TAB
2-4 mg | ORAL_TABLET | Freq: Every evening | ORAL | 3 refills | Status: AC | PRN
Start: 2023-01-20 — End: ?

## 2023-01-20 NOTE — Progress Notes
[facility] - Interventional Pain  Follow Up Visit  Subjective     Chief Complaint:   Chief Complaint   Patient presents with    Left Arm - Pain    Spine - Pain    Left Shoulder - Pain       HPI: Karen Barr is a 37 y.o. female who  has a past medical history of Blood in stool and Depression. who presents for evaluation.    She was last seen one year ago for CESI.   She had excellent resolution until 08/2022.   So she had near 100% relief for about 7 months.     She now presents for follow up visit.     She is now having pain in the neck, into the shoulders, and down the LUE>RUE.     +N/T    It radiates into the 2nd/3rd digits primarily, but sometimes 4th/5th as well.     +muscle stiffness/tightness.     +tinnitus (she has chronic, but seems a little different more recently with this pain).        PRIOR MEDICATIONS:   Effective      Ineffective  Medrol dose pack was not beneficial  Acetaminophen    Unable to tolerate      Never  Gabapentin   Lyrica  Ami/Nortriptyline  Cymbalta  Tizanidine    PRIOR INTERVENTIONS:  No spine surgery  Effective  CESI  Physician-ordered PT  Chiropractic (little)    Ineffective          [name] denies any recent fevers, chills, infection, bowel or bladder incontinence, saddle anesthesia, bleeding issues, or recent anticoagulant.     Past Medical History:  Past Medical History:   Diagnosis Date    Blood in stool     Depression        Family History:  Family History   Problem Relation Name Age of Onset    Diabetes Mother      High Cholesterol Mother      Hypertension Mother      Miscarriages Mother      Diabetes Sister      Cancer Maternal Grandmother      High Cholesterol Maternal Grandmother         Social History:  Lives in [place] [address]    Social History     Socioeconomic History    Marital status: Married     Spouse name: Irving Burton    Number of children: 0   Occupational History    Occupation: Arboriculturist   Tobacco Use    Smoking status: Never    Smokeless tobacco: Never   Vaping Use    Vaping status: Never Used   Substance and Sexual Activity    Alcohol use: Not Currently    Drug use: No    Sexual activity: Yes     Partners: Female       Allergies:  No Known Allergies    Medications:    Current Outpatient Medications:     albuterol sulfate (PROAIR HFA) 90 mcg/actuation HFA aerosol inhaler, Inhale two puffs by mouth into the lungs every 6 hours as needed., Disp: 8.5 g, Rfl: 0    Physical examination:   BP 128/85 (BP Source: Arm, Right Upper, Patient Position: Sitting)  - Pulse 82  - LMP 12/09/2022 (Exact Date)  - SpO2 98%   Pain Score: Two    General: The patient is a well-developed, well nourished 37 y.o.  female in no acute distress.   HEENT: Head is normocephalic and atraumatic.   Cardiac: Based on palpation, pulse appears to be regular rate and rhythm.   Pulmonary: The patient has unlabored respirations and bilateral symmetric chest excursion.   Abdomen: Soft, nontender, and nondistended.   Extremities: No ankle or wrist edema.     Neurologic:   The patient is alert and oriented times 3.     Musculoskeletal:     C-Spine   There is mild low cervical paraspinal tenderness. Paraspinal muscle tone is increased.  ROM with flexion, extension, rotation, and lateral bending is intact but can result in some LUE radicular pain.   Strength is equal and adequate bilaterally in the flexors and extensors of the bilateral upper extremities.   Hoffman's is negative bilaterally.      Results for orders placed during the hospital encounter of 04/06/21    MRI C-SPINE WO CONTRAST    Narrative  EXAM: MRI C-SPINE    HISTORY:    Neck/back pain radiating down the left upper extremity resulting in weakness and numbness. Approximately [redacted] weeks pregnant.    Technique: Multiple sagittal and axial MR sequences were obtained of the cervical spine.    Comparison: None    FINDINGS:    Slight reversal of the cervical lordosis centered at C5-C6. The vertebral body heights are maintained. Diffuse marrow T1 hypointensity. The cervical cord is normal in signal.    At C5-C6: Eccentric right-sided posterior disc osteophyte complex and ligamentum flavum thickening results in moderate central spinal stenosis.    At C6-C7: Left posterior disc osteophyte complex and disc herniation and ligamentum flavum thickening resulting in severe central spinal stenosis with cord compression, although, no obvious cord edema.    Degenerative disc disease and uncovertebral arthrosis results in multilevel neural foraminal stenosis, most pronounced and of at least moderate degree on the left at C6-C7 and at least mild degree on the right at C5-C6 and C6-C7, although, evaluation of the foramina is limited due to patient motion on axial imaging. Tiny perineural root sleeve cysts cysts on the left at C5-6 and C6-C7.    The paraspinous soft tissues are unremarkable.    Impression  1.  Left posterior disc osteophyte complex and disc herniation at C6-C7 with severe central spinal stenosis and cord deformation, though, no obvious cord edema.  2.  Moderate degenerative central spinal stenosis (eccentric to the right at C5-C6).  3.  Multilevel degenerative neural foraminal stenosis, most pronounced of at least moderate degree on the left at C6-C7, although, evaluation of the foramina is limited due to patient motion on axial imaging.  4.  T1 hypointense marrow signal which may be physiologic, though, marrow hyperplasia could appear similar.      Approved by Gwinda Passe, MD on 04/06/2021 12:59 PM    By my electronic signature, I attest that I have personally reviewed the images for this examination and formulated the interpretations and opinions expressed in this report      Finalized by Desma Maxim, M.D. on 04/06/2021 1:47 PM. Dictated by Gwinda Passe, MD on 04/06/2021 12:32 PM.          Last Cr and LFT's:  Creatinine   Date Value Ref Range Status   09/15/2021 0.53 0.4 - 1.00 MG/DL Final     AST (SGOT)   Date Value Ref Range Status   09/15/2021 36 7 - 40 U/L Final     ALT (SGPT)   Date Value Ref Range Status  09/15/2021 11 7 - 56 U/L Final     Alk Phosphatase   Date Value Ref Range Status   09/15/2021 73 25 - 110 U/L Final     Total Bilirubin   Date Value Ref Range Status   09/15/2021 0.4 0.3 - 1.2 MG/DL Final          Assessment:    Karen Barr is a 37 y.o. female who  has a past medical history of Blood in stool and Depression. who presents for evaluation of pain.    The pain complaints are most likely due to:    1. Cervical radiculopathy        2. Muscle spasm        3. DDD (degenerative disc disease), cervical            Plan:  Plan for CESI C7-T1 in PROCEDURES.   This patient's clinical history, exam, AND imaging support radiculopathy AND there is a significant impact on quality of life and function AND the pain has been present for at least 4 weeks AND they have failed to improve with noninvasive conservative care.   This patient had at least 50% pain relief for at least 3 months with the last epidural injection.     Continue HEP.   Avoid heavy lifting.   She is having excellent long term relief of her symptoms with injections. No concerning symptoms, no gait instability, focal weakness, bowel/bladder incontinence. However, upon further questioning, she does note occasionally a little bit of decline with fine motor movements of her hands (rare, but occasionally occurs). Given severe SS on MRI and these rare symptoms, will refer for Ortho Spine evaluation for an opinion.   Will trial tizanidine 2-4mg  qhs prn.   Follow up as needed.    Risks/benefits of all pharmacologic and interventional treatments discussed and questions answered.

## 2023-01-27 ENCOUNTER — Encounter: Admit: 2023-01-27 | Discharge: 2023-01-27 | Payer: Private Health Insurance - Indemnity

## 2023-01-28 ENCOUNTER — Encounter: Admit: 2023-01-28 | Discharge: 2023-01-28 | Payer: Private Health Insurance - Indemnity

## 2023-01-28 NOTE — Patient Instructions
It was nice to see you today.  Thank you for choosing to visit our clinic.  Your time is important, and if you had to wait today, we do apologize.  Our goal is to run exactly on time.  However, on occasion, we get behind in clinic due to unexpected patient issues.  Thank you for your patience.    General Instructions:  Scheduling:  Our scheduling phone number is [phone].  Appointment Reminders on your cell phone:  Communication preferences can be managed in MyChart to ensure you receive important appointment notifications  How to reach our office:  Please send a MyChart message to the Spine Center (directed to Dr. Cordell) or leave a voicemail for the nurse, Jonothan Heberle, at [phone].  Support for many chronic illnesses is available through [facility] at [email or web address] or [phone].    For help with MyChart:  please call [phone].    For more information on spinal conditions:  please visit [email or web address]     Again, thank you for coming in today.

## 2023-01-31 ENCOUNTER — Encounter: Admit: 2023-01-31 | Discharge: 2023-01-31 | Payer: Private Health Insurance - Indemnity

## 2023-01-31 ENCOUNTER — Ambulatory Visit: Admit: 2023-01-31 | Discharge: 2023-01-31 | Payer: Private Health Insurance - Indemnity

## 2023-01-31 DIAGNOSIS — M5412 Radiculopathy, cervical region: Secondary | ICD-10-CM

## 2023-02-02 ENCOUNTER — Encounter: Admit: 2023-02-02 | Discharge: 2023-02-02 | Payer: Private Health Insurance - Indemnity

## 2023-02-02 NOTE — Telephone Encounter
Pre Visit Planning - New Patient    Reason for Visit: cervical radiculopathy (neck pain with numbness in left arm/hand/fingers, starting to aprear on right too    Referred by: Dr. Lourdes Sledge    Imaging: O2     Scoliosis: no    Previous spine surgery: no    Pain Management: Latif    EMG/NCS: no    Records Requested: N/A    Orders have been pended    New patient packet: pt to complete online; patient made aware that this must be done PRIOR to visit or they will have to reschedule.

## 2023-02-03 ENCOUNTER — Encounter: Admit: 2023-02-03 | Discharge: 2023-02-03 | Payer: Private Health Insurance - Indemnity

## 2023-02-03 ENCOUNTER — Ambulatory Visit: Admit: 2023-02-03 | Discharge: 2023-02-03 | Payer: Private Health Insurance - Indemnity

## 2023-02-03 DIAGNOSIS — D539 Nutritional anemia, unspecified: Secondary | ICD-10-CM

## 2023-02-03 DIAGNOSIS — M5134 Other intervertebral disc degeneration, thoracic region: Secondary | ICD-10-CM

## 2023-02-03 DIAGNOSIS — R519 Generalized headaches: Secondary | ICD-10-CM

## 2023-02-03 DIAGNOSIS — K921 Melena: Secondary | ICD-10-CM

## 2023-02-03 DIAGNOSIS — F419 Anxiety disorder, unspecified: Secondary | ICD-10-CM

## 2023-02-03 DIAGNOSIS — F32A Depression: Secondary | ICD-10-CM

## 2023-02-03 DIAGNOSIS — M5136 Other intervertebral disc degeneration, lumbar region: Secondary | ICD-10-CM

## 2023-02-03 DIAGNOSIS — M4800 Spinal stenosis, site unspecified: Secondary | ICD-10-CM

## 2023-02-03 DIAGNOSIS — M5412 Radiculopathy, cervical region: Secondary | ICD-10-CM

## 2023-02-03 DIAGNOSIS — M4802 Spinal stenosis, cervical region: Secondary | ICD-10-CM

## 2023-02-03 DIAGNOSIS — M5030 Other cervical disc degeneration, unspecified cervical region: Secondary | ICD-10-CM

## 2023-02-03 NOTE — Progress Notes
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    Depression     Generalized headaches     Spinal stenosis 04/06/21    Unspecified deficiency anemia          Surgical History:   Procedure Laterality Date    *Katrinka Blazing and nephew fast fix 360* ARTHROSCOPY LEFTKNEE WITH PARTIAL LATERAL  MENISCECTOMY Left 03/19/2020    Performed by Talmadge Chad, MD at [facility] OR    COLONOSCOPY DIAGNOSTIC WITH SPECIMEN COLLECTION BY BRUSHING/ WASHING - FLEXIBLE N/A 07/08/2020    Performed by Buckles, Vinnie Level, MD at [facility] OR    CHOLECYSTECTOMY      HX SHOULDER ARTHROSCOPY      HX TONSILLECTOMY      KNEE SURGERY  03/2020         No Known Allergies      Current Outpatient Medications on File Prior to Visit   Medication Sig Dispense Refill    albuterol sulfate (PROAIR HFA) 90 mcg/actuation HFA aerosol inhaler Inhale two puffs by mouth into the lungs every 6 hours as needed. 8.5 g 0    tiZANidine (ZANAFLEX) 2 mg tablet Take one tablet to two tablets by mouth at bedtime as needed. 60 tablet 3     No current facility-administered medications on file prior to visit.         family history includes Arthritis in her maternal grandmother; Back pain in her mother and sister; Cancer in her maternal grandmother; Diabetes in her mother and sister; High Cholesterol in her maternal grandmother and mother; Hypertension in her mother; Miscarriages in her mother; Neck Pain in her mother.      Social History     Socioeconomic History    Marital status: Married     Spouse name: Irving Burton Number of children: 0   Occupational History    Occupation: Arboriculturist   Tobacco Use    Smoking status: Never    Smokeless tobacco: Never   Vaping Use    Vaping status: Never Used   Substance and Sexual Activity    Alcohol use: Yes     Alcohol/week: 4.0 standard drinks of alcohol     Types: 4 Drinks containing 0.5 oz of alcohol per week    Drug use: Never    Sexual activity: Yes     Partners: Female     Birth control/protection: None         Review of Systems       HPI / PHYSICAL EXAM / RADIOGRAPHIC EVALUATION /  IMPRESSION / PLAN      Dictation on: 02/03/2023  2:08 PM by: Wyona Almas [[email or web address]]  Total time 60 minutes.

## 2023-02-22 ENCOUNTER — Encounter: Admit: 2023-02-22 | Discharge: 2023-02-22 | Payer: Private Health Insurance - Indemnity

## 2023-03-01 ENCOUNTER — Ambulatory Visit: Admit: 2023-03-01 | Discharge: 2023-03-01 | Payer: Private Health Insurance - Indemnity

## 2023-03-01 ENCOUNTER — Encounter: Admit: 2023-03-01 | Discharge: 2023-03-01 | Payer: Private Health Insurance - Indemnity

## 2023-03-01 DIAGNOSIS — K921 Melena: Secondary | ICD-10-CM

## 2023-03-01 DIAGNOSIS — F419 Anxiety disorder, unspecified: Secondary | ICD-10-CM

## 2023-03-01 DIAGNOSIS — R519 Generalized headaches: Secondary | ICD-10-CM

## 2023-03-01 DIAGNOSIS — D539 Nutritional anemia, unspecified: Secondary | ICD-10-CM

## 2023-03-01 DIAGNOSIS — M5412 Radiculopathy, cervical region: Secondary | ICD-10-CM

## 2023-03-01 DIAGNOSIS — M4800 Spinal stenosis, site unspecified: Secondary | ICD-10-CM

## 2023-03-01 DIAGNOSIS — M5030 Other cervical disc degeneration, unspecified cervical region: Secondary | ICD-10-CM

## 2023-03-01 DIAGNOSIS — M5136 Other intervertebral disc degeneration, lumbar region: Secondary | ICD-10-CM

## 2023-03-01 DIAGNOSIS — M5134 Other intervertebral disc degeneration, thoracic region: Secondary | ICD-10-CM

## 2023-03-01 DIAGNOSIS — F32A Depression: Secondary | ICD-10-CM

## 2023-03-01 MED ORDER — SODIUM CHLORIDE 0.9 % IJ SOLN
3 mL | Freq: Once | INTRAMUSCULAR | 0 refills | Status: DC
Start: 2023-03-01 — End: 2023-03-01

## 2023-03-01 MED ORDER — IOHEXOL 240 MG IODINE/ML IV SOLN
1 mL | Freq: Once | EPIDURAL | 0 refills | Status: CP
Start: 2023-03-01 — End: ?
  Administered 2023-03-01: 21:00:00 1 mL via EPIDURAL

## 2023-03-01 MED ORDER — TRIAMCINOLONE ACETONIDE 40 MG/ML IJ SUSP
80 mg | Freq: Once | EPIDURAL | 0 refills | Status: CP
Start: 2023-03-01 — End: ?
  Administered 2023-03-01: 21:00:00 80 mg via EPIDURAL

## 2023-03-01 NOTE — Progress Notes
SPINE CENTER  INTERVENTIONAL PAIN PROCEDURE HISTORY AND PHYSICAL    Chief Complaint: Pain    HISTORY OF PRESENT ILLNESS:    Patient returns today for interventional treatment of neck pain. Patient denies any recent fevers, chills, infection, antibiotics, coagulopathy or contraindicated anticoagulants. Risks of the procedure were discussed including but not limited to bleeding, infection, damage to surrounding structures and reaction to medications. Patient reports understanding and has elected to proceed with the procedure.      This patient's clinical history, exam, AND imaging support radiculopathy AND there is a significant impact on quality of life and function AND the pain has been present for at least 4 weeks AND they have failed to improve with noninvasive conservative care.     This patient had at least 50% pain relief for at least 3 months with the last epidural injection. The pain score was 9/10 prior to the injection and 2/10 following the injection.      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    Depression     Generalized headaches     Spinal stenosis 04/06/21    Unspecified deficiency anemia        Surgical History:   Procedure Laterality Date    *Katrinka Blazing and nephew fast fix 360* ARTHROSCOPY LEFTKNEE WITH PARTIAL LATERAL  MENISCECTOMY Left 03/19/2020    Performed by Talmadge Chad, MD at [facility] OR    COLONOSCOPY DIAGNOSTIC WITH SPECIMEN COLLECTION BY BRUSHING/ WASHING - FLEXIBLE N/A 07/08/2020    Performed by Buckles, Vinnie Level, MD at [facility] OR    CHOLECYSTECTOMY      HX SHOULDER ARTHROSCOPY      HX TONSILLECTOMY      KNEE SURGERY  03/2020       family history includes Arthritis in her maternal grandmother; Back pain in her mother and sister; Cancer in her maternal grandmother; Diabetes in her mother and sister; High Cholesterol in her maternal grandmother and mother; Hypertension in her mother; Miscarriages in her mother; Neck Pain in her mother.    Social History     Socioeconomic History    Marital status: Married     Spouse name: Irving Burton    Number of children: 0   Occupational History    Occupation: Arboriculturist   Tobacco Use    Smoking status: Never    Smokeless tobacco: Never   Vaping Use    Vaping status: Never Used   Substance and Sexual Activity    Alcohol use: Yes     Alcohol/week: 4.0 standard drinks of alcohol     Types: 4 Drinks containing 0.5 oz of alcohol per week    Drug use: Never    Sexual activity: Yes     Partners: Female     Birth control/protection: None       No Known Allergies    There were no vitals filed for this visit.       REVIEW OF SYSTEMS: 10 point ROS obtained and negative except as above.      PHYSICAL EXAM:  General: Alert, cooperative, no acute distress.  HEENT: Normocephalic, atraumatic.  Neck: Supple.  Lungs: Unlabored respirations, bilateral and equal chest excursion.  Heart: Regular rate.  Skin: Warm and dry to touch.  Abdomen: Nondistended.  MSK: No  deformity  Neurological: Alert and oriented x3.        IMPRESSION:    1. Cervical radiculopathy         PLAN:  CESI C7-T1    Julien Girt, MD  Pain Fellow, PGY-5

## 2023-03-01 NOTE — Procedures
Attending Surgeon: Evelina Bucy, MD    Anesthesia: Local    Pre-Procedure Diagnosis:   1. Cervical radiculopathy        Post-Procedure Diagnosis:   1. Cervical radiculopathy        [facility] INJECT INTERLAM CRV/THRC  Procedure: epidural - interlaminar    Laterality: n/a   on 03/01/2023 3:45 PM  Location: cervical ESI with imaging - C7-T1      Consent:   Consent obtained: written  Consent given by: patient  Risks discussed: allergic reaction, bleeding, bruising, infection, never damage, no change or worsening in pain, pneumo thorax, reaction to medication, seizure, swelling and weakness  Alternatives discussed: alternative treatment, delayed treatment and no treatment  Discussed with patient the purpose of the treatment/procedure, other ways of treating my condition, including no treatment/ procedure and the risks and benefits of the alternatives. Patient has decided to proceed with treatment/procedure.        Universal Protocol:  Relevant documents: relevant documents present and verified  Site marked: the operative site was marked  Patient identity confirmed: Patient identify confirmed verbally with patient.        Time out: Immediately prior to procedure a time out was called to verify the correct patient, procedure, equipment, support staff and site/side marked as required      Procedures Details:   Indications: pain   Prep: chlorhexidine  Patient position: prone  Estimated Blood Loss: minimal  Specimens: none  Number of Levels: 1  Approach: midline  Guidance: fluoroscopy  Contrast: Procedure confirmed with contrast under live fluoroscopy.  Needle and Epidural Catheter: tuohy  Needle size: 18 G  Injection procedure: Incremental injection and Negative aspiration for blood  Amount Injected:   C7-T1: 4mL  Patient tolerance: Patient tolerated the procedure well with no immediate complications. Pressure was applied, and hemostasis was accomplished.  Outcome: Pain unchanged  Comments:   CERVICAL INTERLAMINAR EPIDURAL STEROID INJECTION    PROCEDURE:  1) C7-T1 interlaminar epidural steroid injection  2) Fluoroscopic needle guidance    REASON FOR PROCEDURE: Cervical radiculopathy, DDD (cervical)    ATTENDING PHYSICIAN: Evelina Bucy, MD    MEDICATIONS INJECTED: 2 mL of triamciniolone (80 mg) and 2 mL of sterile, preservative-free normal saline    LOCAL ANESTHETIC INJECTED: 1 mL of 1% lidocaine    SEDATION MEDICATIONS: None    ESTIMATED BLOOD LOSS: None    SPECIMENS REMOVED: None    COMPLICATIONS: None    TECHNIQUE: Time-out was taken to identify the correct patient, procedure and side prior to starting the procedure. With the patient lying in a prone position with the neck in a slightly  flexed position, the area was prepped and draped in sterile fashion using DuraPrep and a fenestrated drape. The area was determined under fluoroscopic guidance. A 27-gauge, 1.25-inch  needle was used to anesthetize the needle entry site and subcutaneous tissues.     The 18-gauge, 3.5-inch Tuohy needle was advanced through the ligamentum flavum using loss of resistance technique. Once the tip of the needle was thought to be in the desired position, contrast was injected to confirm only epidural spread and no vascular runoff via A-P and lateral  views. The injectate was then injected slowly with intermittent negative aspiration.    The procedure was completed without complications and was tolerated well. The patient was monitored after the procedure. The patient (or responsible party) was given post-procedure and  discharge instructions to follow at home. The patient was discharged in stable condition.  Evelina Bucy, MD, MBA      Estimated blood loss: none or minimal  Specimens: none  Patient tolerated the procedure well with no immediate complications. Pressure was applied, and hemostasis was accomplished.  Administrations This Visit       iohexoL (OMNIPAQUE-240) 240 mg/mL injection 1 mL       Admin Date  03/01/2023 Action  Given Dose  1 mL Route  Epidural Documented By  Evelina Bucy, MD              triamcinolone acetonide (KENALOG-40) injection 80 mg       Admin Date  03/01/2023 Action  Given Dose  80 mg Route  Epidural Documented By  Evelina Bucy, MD

## 2023-03-01 NOTE — Patient Instructions
Procedure Completed Today: Cervical Epidural Steroid Injection    Important information following your procedure today: You may drive today    Pain relief may not be immediate. It is possible you may even experience an increase in pain during the first 24-48 hours followed by a gradual decrease of your pain.  Though the procedure is generally safe and complications are rare, we do ask that you be aware of any of the following:   Any swelling, persistent redness, new bleeding, or drainage from the site of the injection.  You should not experience a severe headache.  You should not run a fever over 101? F.  New onset of sharp, severe back & or neck pain.  New onset of upper or lower extremity numbness or weakness.  New difficulty controlling bowel or bladder function after the injection.  New shortness of breath.    If any of these occur, please call to report this occurrence to Dr. Latif at [phone]. If you are calling after 4:00 p.m., on weekends or holidays please call [phone] and ask to have the resident physician on call for the physician paged or go to your local emergency room.  You may experience soreness at the injection site. Ice can be applied at 20 minute intervals. Avoid application of direct heat, hot showers or hot tubs today.  Avoid strenuous activity today. You may resume your regular activities and exercise tomorrow.  Patients with diabetes may see an elevation in blood sugars for 7-10 days after the injection. It is important to pay close attention to your diet, check your blood sugars daily and report extreme elevations to the physician that treats your diabetes.  Patients taking a daily blood thinner can resume their regular dose this evening.  It is important that you take all medications ordered by your pain physician. Taking medication as ordered is an important part of your pain care plan. If you cannot continue the medication plan, please notify the physician.     Possible side effects to steroids that may occur:  Flushing or redness of the face  Irritability  Fluid retention  Change in women?s menses    The following medications were used: Lidocaine , Triamcinolone  , and Contrast Dye

## 2023-03-01 NOTE — Progress Notes

## 2023-03-10 NOTE — Progress Notes
RN Chart Review    Last seen:  - 11/26/2021 in [facility] for Postpartum following a primary C-section at [facility]. Postpartum course has been uncomplicated. Patient is currently breast feeding and using formula. Provider states that it is okay to resume physical and sexual activity.    Since then:  - 01/04/2023 at [facility] for Acute non-recurrent frontal sinusitis. Patient prescribed:  -     amoxicillin-potassium clavulanate (AUGMENTIN) 875/125 mg tablet; Take one tablet by mouth twice daily with meals.  -     benzonatate (TESSALON PERLES) 100 mg capsule; Take one capsule by mouth every 8 hours.  -     albuterol sulfate (PROAIR HFA) 90 mcg/actuation HFA aerosol inhaler; Inhale two puffs by mouth into the lungs every 6 hours as needed.    - 10/06/2022 [facility] for annual cleaning    -01/20/2023 at Sp[facility] Clinic She was last seen one year ago for CESI. She is now experiencing neck pain that radiates into the shoulders and down the LUE and RUE. Pain is associated with muscle stiffness and tightness. Patient was prescribed Tizanidine 2-4 mg qhs PRN.   -MRI of spine shows:   1.  Left posterior disc osteophyte complex and disc herniation at C6-C7 with severe central spinal stenosis and cord deformation, though, no obvious cord edema.  2.  Moderate degenerative central spinal stenosis (eccentric to the right at C5-C6).  3.  Multilevel degenerative neural foraminal stenosis, most pronounced of at least moderate degree on the left at C6-C7, although, evaluation of the foramina is limited due to patient motion on axial imaging.  4.  T1 hypointense marrow signal which may be physiologic, though, marrow hyperplasia could appear similar.      -03/01/2023 at Spine Center for C7-T1 interlaminar epidural steroid injection     Here Today For:  - Annual Physical and Cervical Cancer Screening. Patient is due for 3rd dose of HPV Vaccine.

## 2023-03-11 ENCOUNTER — Encounter: Admit: 2023-03-11 | Discharge: 2023-03-11 | Payer: Private Health Insurance - Indemnity

## 2023-03-11 ENCOUNTER — Ambulatory Visit: Admit: 2023-03-11 | Discharge: 2023-03-11 | Payer: Private Health Insurance - Indemnity

## 2023-03-11 DIAGNOSIS — K51211 Ulcerative (chronic) proctitis with rectal bleeding: Secondary | ICD-10-CM

## 2023-03-11 DIAGNOSIS — M5134 Other intervertebral disc degeneration, thoracic region: Secondary | ICD-10-CM

## 2023-03-11 DIAGNOSIS — M5136 Other intervertebral disc degeneration, lumbar region: Secondary | ICD-10-CM

## 2023-03-11 DIAGNOSIS — F32A Depression: Secondary | ICD-10-CM

## 2023-03-11 DIAGNOSIS — Z0000 Encounter for general adult medical examination without abnormal findings: Secondary | ICD-10-CM

## 2023-03-11 DIAGNOSIS — Z6836 Body mass index (BMI) 36.0-36.9, adult: Secondary | ICD-10-CM

## 2023-03-11 DIAGNOSIS — F419 Anxiety disorder, unspecified: Secondary | ICD-10-CM

## 2023-03-11 DIAGNOSIS — M4800 Spinal stenosis, site unspecified: Secondary | ICD-10-CM

## 2023-03-11 DIAGNOSIS — N979 Female infertility, unspecified: Secondary | ICD-10-CM

## 2023-03-11 DIAGNOSIS — F331 Major depressive disorder, recurrent, moderate: Secondary | ICD-10-CM

## 2023-03-11 DIAGNOSIS — D539 Nutritional anemia, unspecified: Secondary | ICD-10-CM

## 2023-03-11 DIAGNOSIS — M5030 Other cervical disc degeneration, unspecified cervical region: Secondary | ICD-10-CM

## 2023-03-11 DIAGNOSIS — K921 Melena: Secondary | ICD-10-CM

## 2023-03-11 DIAGNOSIS — R519 Generalized headaches: Secondary | ICD-10-CM

## 2023-03-11 MED ORDER — LETROZOLE 2.5 MG PO TAB
2.5 mg | ORAL_TABLET | Freq: Every day | ORAL | 0 refills | 16.50000 days | Status: AC
Start: 2023-03-11 — End: ?

## 2023-03-11 NOTE — Progress Notes
Note to patient: The 21st Century Cures Act makes medical notes like these available to patients in the interest of transparency. However, be advised this is a medical document. It is intended as peer to peer communication. It is written in medical language and may contain abbreviations or words that are unfamiliar. It may appear blunt or direct. Medical documents are intended to carry relevant information, facts as evident, and the clinical opinion of the clinician.    Date of Service: 03/11/2023    Karen Barr is a 37 y.o. female.  DOB: April 09, 1986  MRN: [id number]     Subjective:             History of Present Illness     The patient, with a history of colitis, reports a period of remission since September 2023. She had a flare-up postpartum. Since then, she has not been on any medication and has not experienced any further flare-ups.    The patient is scheduled with anesthesia for back injections.    The patient has recently started a ketogenic diet and reports a loss of three percent body fat in two weeks. She expresses confusion over the lack of ketones detected in her urine, despite feeling well and losing weight. She has previously tested positive for ketones while on this diet.    The patient also mentions a desire to return to her pre-pregnancy weight of 220 lbs. She started the diet at 250 lbs and has since dropped to 237 lbs. Once she reaches her goal weight, she plans to transition off the ketogenic diet and maintain a more balanced diet, avoiding breads and pastas.    The patient is also considering trying for another child in the next two to three months and expresses a desire to start letrozole again to track her LH surges. She has one try left with the same donor.       Screening based on USPSTF Guidelines: <28 years old  Laboratory Screening    HTN Screening performed: is not hypertensive.   DM Screening DM screening is indicated.   Lipids Screening is indicated based on age and risk factors. Cancer Screening    Colon Cancer Colonoscopy is maybe indicated. GI referral entered   Breast Cancer Screening mammogram is not indicated.   Cervical Cancer Last pap 2018.  Is due.       Behavioral Interventions    Alcohol use Counseling for at risk drinking is not indicated.   Tobacco use Counseling for tobacco cessation is not indicated.   Contraception Patient reports using n/a for contraception.   Menstrual History Having periods? Yes   Sexual History Desires STI screening? No   Mood Negative PHQ-2. reviewed   Immunization Reviewed       The ASCVD Risk score (Arnett DK, et al., 2019) failed to calculate for the following reasons:    The 2019 ASCVD risk score is only valid for ages 83 to 24         Review of Systems  10 point ROS completed and negative unless stated above.       Objective:          albuterol sulfate (PROAIR HFA) 90 mcg/actuation HFA aerosol inhaler Inhale two puffs by mouth into the lungs every 6 hours as needed.    letrozole [facility]) 2.5 mg tablet Take one tablet by mouth daily. Take one tablet by mouth daily for 5 days, start on day 3 of your menstrual cycle. (To be taken on cycle  days 3-7)    tiZANidine (ZANAFLEX) 2 mg tablet Take one tablet to two tablets by mouth at bedtime as needed.     Vitals:    03/11/23 1029   Weight: 111.2 kg (245 lb 3.2 oz)   Height: 175.3 cm (5' 9.02)     Body mass index is 36.19 kg/m?Marland Kitchen     Physical Exam  General: No acute distress, well-appearing.  Eyes: sclera anicteric; conjunctiva clear and well perfused  HEENT: Atraumatic, normocephalic, MMM  Neck: no JVD or nodes; soft and supple; TML  Lungs: CTA bilat. Normal respiratory effort.  Symmetric expansion upon inspiration.  CV: S1, S2 normal with regular rate, without rub, gallop or murmur.  Skin: Warm, moist, normal turgor; no rash appreciated on limited inspection.  Neuro: Alert and grossly oriented with stable level of consciousness. Good tone.  No gross focal deficits appreciated.  Psych: Dressed in street clothes.  Calm and cooperative. Affect normal. Linear logical and goal directed.         Assessment and Plan:  [name] was seen today for physical and medication request.    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 Take one tablet by mouth daily for 5 days, start on day 3 of your menstrual cycle. (To be taken on cycle days 3-7)    Major depressive disorder, recurrent, moderate (HCC)          History of Colitis: No recent flare-ups or symptoms since September 2023. Off medications since then. Discussed the importance of regular follow-ups with GI and periodic colonoscopies even in the absence of symptoms.  -Referral to GI for follow-up and consideration of colonoscopy.    Ketogenic Diet: Patient reports weight loss and increased energy, but urine ketone tests are negative. Discussed that the goal of the diet is to feel good and lose weight, not necessarily to be in ketosis.  -Continue current diet as it is working for the patient.  -Consider nutrition referral if needed in the future.    Family Planning: Plans to try for another child in the next 2-3 months. Previous successful pregnancy with letrozole.  -Order letrozole for next menstrual cycle.    Cervical Cancer Screening: Due for Pap smear.  -Schedule Pap smear at a future visit.    General Health Maintenance: Unclear when last eye exam was, dental exam earlier this year.  -Recommend scheduling eye exam.  -Continue regular dental exams.         Patient Instructions   Go to lab  Schedule with me for pap smear  Schedule with gastroenterology for follow up    FOODS TO AVOID:  - Bread and everything else made with flour  - Cereals  - Potatoes and sweet  potatoes  - Foods containing added sugar (READ LABELS)  - All sweets  - All sugary drinks like juice and soda    EAT AS MUCH AS YOU LIKE:  - Meat, fish, poultry  - Green vegetables  - Eggs  - Cheese  - Fruit except bananas and grapes    EAT IN MODERATION:  - Nuts except cashews and pistachios    Eat: Meat, fish, eggs, vegetables growing above ground and natural fats (like butter).  Avoid: Sugar and starchy foods (like bread, pasta, rice, beans and potatoes).    For more information:  [email or web address]        Problem   Moderate Episode of Recurrent Major Depressive Disorder (Hcc)       Moderate episode of recurrent major depressive disorder (HCC)   - lab work reviewed, vitals and symptoms reviewed   - current condition and recent clinical history evaluated  - chronic condition, stable   - will continue current management and surveillance

## 2023-03-11 NOTE — Assessment & Plan Note
-   lab work reviewed, vitals and symptoms reviewed   - current condition and recent clinical history evaluated  - chronic condition, stable   - will continue current management and surveillance

## 2023-03-31 ENCOUNTER — Encounter: Admit: 2023-03-31 | Discharge: 2023-03-31 | Payer: Private Health Insurance - Indemnity

## 2023-04-02 ENCOUNTER — Encounter: Admit: 2023-04-02 | Discharge: 2023-04-02 | Payer: Private Health Insurance - Indemnity

## 2023-04-02 DIAGNOSIS — N979 Female infertility, unspecified: Secondary | ICD-10-CM

## 2023-04-02 MED ORDER — LETROZOLE 2.5 MG PO TAB
2.5 mg | ORAL_TABLET | Freq: Every day | ORAL | 0 refills | 16.50000 days | Status: AC
Start: 2023-04-02 — End: ?

## 2023-04-09 ENCOUNTER — Encounter: Admit: 2023-04-09 | Discharge: 2023-04-09 | Payer: Private Health Insurance - Indemnity

## 2023-04-09 DIAGNOSIS — S83104A Unspecified dislocation of right knee, initial encounter: Secondary | ICD-10-CM

## 2023-04-09 NOTE — Telephone Encounter
Pt states she hurt her R knee last night during softball. She states she heard and felt a pop evaluated at a local hospital in [place] where an xray was completed. Xray negative. She states she is needing an MRI completed and would like to have one placed and she can have completed when she returns back this weekend.

## 2023-04-14 ENCOUNTER — Encounter: Admit: 2023-04-14 | Discharge: 2023-04-14 | Payer: Private Health Insurance - Indemnity

## 2023-04-14 ENCOUNTER — Ambulatory Visit: Admit: 2023-04-14 | Discharge: 2023-04-14 | Payer: Private Health Insurance - Indemnity

## 2023-04-14 DIAGNOSIS — M5134 Other intervertebral disc degeneration, thoracic region: Secondary | ICD-10-CM

## 2023-04-14 DIAGNOSIS — M4800 Spinal stenosis, site unspecified: Secondary | ICD-10-CM

## 2023-04-14 DIAGNOSIS — F419 Anxiety disorder, unspecified: Secondary | ICD-10-CM

## 2023-04-14 DIAGNOSIS — M5136 Other intervertebral disc degeneration, lumbar region: Secondary | ICD-10-CM

## 2023-04-14 DIAGNOSIS — M5030 Other cervical disc degeneration, unspecified cervical region: Secondary | ICD-10-CM

## 2023-04-14 DIAGNOSIS — M5412 Radiculopathy, cervical region: Secondary | ICD-10-CM

## 2023-04-14 DIAGNOSIS — M7918 Myalgia, other site: Secondary | ICD-10-CM

## 2023-04-14 DIAGNOSIS — R519 Generalized headaches: Secondary | ICD-10-CM

## 2023-04-14 DIAGNOSIS — K921 Melena: Secondary | ICD-10-CM

## 2023-04-14 DIAGNOSIS — M47819 Spondylosis without myelopathy or radiculopathy, site unspecified: Secondary | ICD-10-CM

## 2023-04-14 DIAGNOSIS — D539 Nutritional anemia, unspecified: Secondary | ICD-10-CM

## 2023-04-14 DIAGNOSIS — F32A Depression: Secondary | ICD-10-CM

## 2023-04-14 NOTE — Progress Notes
[facility]      Subjective     Chief Complaint: neck pain    Interval HPI: Karen Barr is a 37 y.o. 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 who now presents for evaluation, accompanied by spouse Karen Barr.    Patient reports about 50% relief after C7-T1 CESI performed on 03/01/2023, she states she is no longer having any RUE pain, but still having moderate pain in the LUE.    She was seen by Dr. Suan Barr and scheduled for BUE EMG for further evaluation for possible surgical intervention.     The pain is in the low neck region, L>R  Will occasional  Denies RUE pain (since CESI)  Pain is constant   Numbness/tingling: yes - left hand/palm  The pain ranges 1-6/10  The pain is exacerbated by cervical extension, lifting objects   The pain is partially alleviated by rest/   +muscle stiffness/tightness  Reports mild weakness in LUE, mostly noted with gripping objects     She has been taking Tizanidine sparingly, which provides some relief, denies any SEs.     +tinnitus    Hx of spine surgery: none     Patient denies fevers, chills, infection, bleeding issues, anticoagulants, new muscle weakness, numbness, tingling, bowel/bladder incontinence, or saddle anesthesia.         PRIOR MEDICATIONS:   Effective      Ineffective  Medrol dose pack was not beneficial  Acetaminophen    Unable to tolerate      Never  Gabapentin   Lyrica  Ami/Nortriptyline  Cymbalta  Tizanidine    PRIOR INTERVENTIONS:  No spine surgery  Effective  CESI  Physician-ordered PT  Chiropractic (little)    Ineffective          [name] denies any recent fevers, chills, infection, bowel or bladder incontinence, saddle anesthesia, bleeding issues, or recent anticoagulant.     Past Medical History:  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    Depression     Generalized headaches     Spinal stenosis 04/06/21    Unspecified deficiency anemia        Family History:  Family History   Problem Relation Name Age of Onset    Diabetes Mother Karen Barr     High Cholesterol Mother Karen Barr     Hypertension Mother Karen Barr     Miscarriages Mother Karen Barr     Back pain Mother Karen Barr     Neck Pain Mother Karen Barr     Diabetes Sister Karen Barr     Cancer Maternal Grandmother Karen Barr     High Cholesterol Maternal Grandmother Karen Barr     Arthritis Maternal Grandmother Karen Barr     Back pain Sister Karen Barr         Fusion       Social History:  Lives in [place] [address]    Social History     Socioeconomic History    Marital status: Married     Spouse name: Karen Barr    Number of children: 1   Occupational History  Occupation: Arboriculturist   Tobacco Use    Smoking status: Never    Smokeless tobacco: Never   Vaping Use    Vaping status: Never Used   Substance and Sexual Activity    Alcohol use: Yes     Alcohol/week: 4.0 standard drinks of alcohol     Types: 4 Drinks containing 0.5 oz of alcohol per week    Drug use: Never    Sexual activity: Yes     Partners: Female     Birth control/protection: None       Allergies:  No Known Allergies    Medications:    Current Outpatient Medications:     albuterol sulfate (PROAIR HFA) 90 mcg/actuation HFA aerosol inhaler, Inhale two puffs by mouth into the lungs every 6 hours as needed., Disp: 8.5 g, Rfl: 0    letrozole (FEMARA) 2.5 mg tablet, Take one tablet by mouth daily. Take one tablet by mouth daily for 5 days, start on day 3 of your menstrual cycle. (To be taken on cycle days 3-7), Disp: 30 tablet, Rfl: 0    tiZANidine (ZANAFLEX) 2 mg tablet, Take one tablet to two tablets by mouth at bedtime as needed., Disp: 60 tablet, Rfl: 3    Physical examination:   BP (P) 129/77  - Pulse (P) 77  - Ht 175.3 cm (5' 9)  - Wt 111.1 kg (245 lb)  - LMP 03/06/2023 (Approximate)  - SpO2 (P) 98%  - BMI 36.18 kg/m?   Pain Score: Eight    General: The patient is a well-developed, well nourished 37 y.o. female in no acute distress.   HEENT: Head is normocephalic and atraumatic.   Cardiac: Based on palpation, pulse appears to be regular rate and rhythm.   Pulmonary: The patient has unlabored respirations and bilateral symmetric chest excursion.   Abdomen: Soft, nontender, and nondistended.   Extremities: No ankle or wrist edema.     Neurologic:   The patient is alert and oriented times 3.     Musculoskeletal:   Gait is antalgic - RLE brace due to recent injury.    C-Spine   There is mild-moderate L>R low cervical paraspinal tenderness. Paraspinal muscle tone is increased.  ROM with flexion, extension, rotation, and lateral bending is intact but can result in some LUE radicular pain.   Strength is adequate bilaterally yet slight weakness noted in LUE.  Hoffman's is negative bilaterally.    Upper extremity strength:   Root Right Left   Shoulder Abduction C5 5 4   Elbow Flexion C5 5 5   Elbow Extension C7 5 5   Wrist Extension C6 5 5                           Results for orders placed during the hospital encounter of 04/06/21    MRI C-SPINE WO CONTRAST    Narrative  EXAM: MRI C-SPINE    HISTORY:    Neck/back pain radiating down the left upper extremity resulting in weakness and numbness. Approximately [redacted] weeks pregnant.    Technique: Multiple sagittal and axial MR sequences were obtained of the cervical spine.    Comparison: None    FINDINGS:    Slight reversal of the cervical lordosis centered at C5-C6. The vertebral body heights are maintained. Diffuse marrow T1 hypointensity. The cervical cord is normal in signal.    At C5-C6: Eccentric right-sided posterior disc osteophyte complex and ligamentum flavum thickening results in  moderate central spinal stenosis.    At C6-C7: Left posterior disc osteophyte complex and disc herniation and ligamentum flavum thickening resulting in severe central spinal stenosis with cord compression, although, no obvious cord edema.    Degenerative disc disease and uncovertebral arthrosis results in multilevel neural foraminal stenosis, most pronounced and of at least moderate degree on the left at C6-C7 and at least mild degree on the right at C5-C6 and C6-C7, although, evaluation of the foramina is limited due to patient motion on axial imaging. Tiny perineural root sleeve cysts cysts on the left at C5-6 and C6-C7.    The paraspinous soft tissues are unremarkable.    Impression  1.  Left posterior disc osteophyte complex and disc herniation at C6-C7 with severe central spinal stenosis and cord deformation, though, no obvious cord edema.  2.  Moderate degenerative central spinal stenosis (eccentric to the right at C5-C6).  3.  Multilevel degenerative neural foraminal stenosis, most pronounced of at least moderate degree on the left at C6-C7, although, evaluation of the foramina is limited due to patient motion on axial imaging.  4.  T1 hypointense marrow signal which may be physiologic, though, marrow hyperplasia could appear similar.      Approved by Gwinda Passe, MD on 04/06/2021 12:59 PM    By my electronic signature, I attest that I have personally reviewed the images for this examination and formulated the interpretations and opinions expressed in this report      Finalized by Desma Maxim, M.D. on 04/06/2021 1:47 PM. Dictated by Gwinda Passe, MD on 04/06/2021 12:32 PM.          Last Cr and LFT's:  Creatinine   Date Value Ref Range Status   09/15/2021 0.53 0.4 - 1.00 MG/DL Final     AST (SGOT)   Date Value Ref Range Status   09/15/2021 36 7 - 40 U/L Final     ALT (SGPT)   Date Value Ref Range Status   09/15/2021 11 7 - 56 U/L Final     Alk Phosphatase Date Value Ref Range Status   09/15/2021 73 25 - 110 U/L Final     Total Bilirubin   Date Value Ref Range Status   09/15/2021 0.4 0.3 - 1.2 MG/DL Final          Assessment:    Loriene Wuerth is a 37 y.o. 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 who presents for evaluation of pain.    The pain complaints are most likely due to:    1. Cervical radiculopathy  [place] AMB SPINE INJECT INTERLAM CRV/THRC    AMB REFERRAL TO PHYSICAL OR OCCUPATIONAL THERAPY      2. DDD (degenerative disc disease), cervical  [place] AMB SPINE INJECT INTERLAM CRV/THRC    AMB REFERRAL TO PHYSICAL OR OCCUPATIONAL THERAPY      3. Myalgia, other site  AMB REFERRAL TO PHYSICAL OR OCCUPATIONAL THERAPY      4. Facet arthropathy  AMB REFERRAL TO PHYSICAL OR OCCUPATIONAL THERAPY            Plan:  Discussed plan of care options with patient and reviewed recent C-spine imaging. Will schedule for repeat C7-T1 CESI at next available appointment.   Could consider C6-C7 CESI in future.  Encouraged to continue with BUE EMG as  scheduled, may consider change in spn ortho provider for further surgical evaluation.  Will continue with Tizanidine sparingly. Advised to avoid when considering family planning, patient verbalized understanding.   Will refer to PT for cervical strengthening.   Follow-up after procedure/surgical eval.     Risks/benefits of all pharmacologic and interventional treatments discussed and questions answered.     Total Time Today was 30 minutes in the following activities: Preparing to see the patient, Obtaining and/or reviewing separately obtained history, Performing a medically appropriate examination and/or evaluation, Counseling and educating the patient/family/caregiver, Ordering medications, tests, or procedures, Referring and communication with other health care professionals (when not separately reported), and Documenting clinical information in the electronic or other health record

## 2023-04-16 ENCOUNTER — Encounter: Admit: 2023-04-16 | Discharge: 2023-04-16 | Payer: Private Health Insurance - Indemnity

## 2023-04-16 ENCOUNTER — Ambulatory Visit: Admit: 2023-04-16 | Discharge: 2023-04-16 | Payer: Private Health Insurance - Indemnity

## 2023-04-16 DIAGNOSIS — S83104A Unspecified dislocation of right knee, initial encounter: Secondary | ICD-10-CM

## 2023-04-20 ENCOUNTER — Encounter: Admit: 2023-04-20 | Discharge: 2023-04-20 | Payer: Private Health Insurance - Indemnity

## 2023-04-20 DIAGNOSIS — S83206A Unspecified tear of unspecified meniscus, current injury, right knee, initial encounter: Secondary | ICD-10-CM

## 2023-04-20 DIAGNOSIS — S83511A Sprain of anterior cruciate ligament of right knee, initial encounter: Secondary | ICD-10-CM

## 2023-04-26 ENCOUNTER — Encounter: Admit: 2023-04-26 | Discharge: 2023-04-26 | Payer: Private Health Insurance - Indemnity

## 2023-04-28 NOTE — Patient Instructions
It was nice to see you today.  Thank you for choosing to visit our clinic.  Your time is important, and if you had to wait today, we do apologize.  Our goal is to run exactly on time.  However, on occasion, we get behind in clinic due to unexpected patient issues.  Thank you for your patience.    General Instructions:  Scheduling:  Our scheduling phone number is [phone].  Appointment Reminders on your cell phone:  Communication preferences can be managed in MyChart to ensure you receive important appointment notifications  How to reach our office:  Please send a MyChart message to the [facility] (directed to Dr. Suan Halter) or leave a voicemail for the nurse, Elonda Husky, at [phone].  Support for many chronic illnesses is available through [employer] at [email or web address]no or [phone].    For help with MyChart:  please call [phone].    For more information on spinal conditions:  please visit [email or web address]     Again, thank you for coming in today.

## 2023-05-03 ENCOUNTER — Ambulatory Visit: Admit: 2023-05-03 | Discharge: 2023-05-04 | Payer: Private Health Insurance - Indemnity

## 2023-05-03 ENCOUNTER — Encounter: Admit: 2023-05-03 | Discharge: 2023-05-03 | Payer: Private Health Insurance - Indemnity

## 2023-05-03 DIAGNOSIS — M5412 Radiculopathy, cervical region: Secondary | ICD-10-CM

## 2023-05-03 DIAGNOSIS — M4802 Spinal stenosis, cervical region: Secondary | ICD-10-CM

## 2023-05-03 NOTE — Procedures
Electrodiagnostic Laboratory Report    Impression:  ABNORMAL    1. There is electrodiagnostic evidence of acute on chronic, left C7 radiculopathy.  2. There is no evidence of bilateral median or ulnar mononeuropathy.    Clinical Correlation:  Follow up with referring physician.    Thank you for allowing me to perform electrodiagnostic testing on your patients.  A full EMG/NCS report will be scanned into O2/EPIC, including waveforms.  If you have any further questions or comments, please do not hesitate to call.    Harland German, MD  Diplomate, American Board of Physical Medicine and Rehabilitation  Diplomate, American Association of Neuromuscular and Electrodiagnostic Medicine

## 2023-05-05 ENCOUNTER — Encounter: Admit: 2023-05-05 | Discharge: 2023-05-05 | Payer: Private Health Insurance - Indemnity

## 2023-05-05 ENCOUNTER — Ambulatory Visit: Admit: 2023-05-05 | Discharge: 2023-05-06 | Payer: Private Health Insurance - Indemnity

## 2023-05-05 DIAGNOSIS — D539 Nutritional anemia, unspecified: Secondary | ICD-10-CM

## 2023-05-05 DIAGNOSIS — M5030 Other cervical disc degeneration, unspecified cervical region: Secondary | ICD-10-CM

## 2023-05-05 DIAGNOSIS — F32A Depression: Secondary | ICD-10-CM

## 2023-05-05 DIAGNOSIS — M5136 Other intervertebral disc degeneration, lumbar region: Secondary | ICD-10-CM

## 2023-05-05 DIAGNOSIS — M4802 Spinal stenosis, cervical region: Secondary | ICD-10-CM

## 2023-05-05 DIAGNOSIS — R519 Generalized headaches: Secondary | ICD-10-CM

## 2023-05-05 DIAGNOSIS — M5412 Radiculopathy, cervical region: Secondary | ICD-10-CM

## 2023-05-05 DIAGNOSIS — F419 Anxiety disorder, unspecified: Secondary | ICD-10-CM

## 2023-05-05 DIAGNOSIS — M4800 Spinal stenosis, site unspecified: Secondary | ICD-10-CM

## 2023-05-05 DIAGNOSIS — M5134 Other intervertebral disc degeneration, thoracic region: Secondary | ICD-10-CM

## 2023-05-05 DIAGNOSIS — K921 Melena: Secondary | ICD-10-CM

## 2023-05-05 NOTE — Progress Notes
30     [facility] CLINIC NOTE     Dictation on: 05/05/2023 10:30 AM by: Wyona Almas [LCORDELL]              Vitals:    05/05/23 0950   BP: 112/68   BP Source: Arm, Left Upper   Pulse: 67   Temp: 36.2 ?C (97.2 ?F)   Resp: 16   SpO2: 100%   TempSrc: Skin   PainSc: Zero   Weight: 111.6 kg (246 lb)   Height: 174.6 cm (5' 8.75)       Review of Systems    Total time 25 minutes.

## 2023-05-07 ENCOUNTER — Encounter: Admit: 2023-05-07 | Discharge: 2023-05-07 | Payer: Private Health Insurance - Indemnity

## 2023-05-17 ENCOUNTER — Encounter: Admit: 2023-05-17 | Discharge: 2023-05-17 | Payer: Private Health Insurance - Indemnity

## 2023-05-28 ENCOUNTER — Encounter: Admit: 2023-05-28 | Discharge: 2023-05-28 | Payer: Private Health Insurance - Indemnity

## 2023-06-07 ENCOUNTER — Encounter: Admit: 2023-06-07 | Discharge: 2023-06-07 | Payer: Private Health Insurance - Indemnity

## 2023-06-08 ENCOUNTER — Encounter: Admit: 2023-06-08 | Discharge: 2023-06-08 | Payer: Private Health Insurance - Indemnity

## 2023-07-27 ENCOUNTER — Encounter: Admit: 2023-07-27 | Discharge: 2023-07-27 | Payer: Private Health Insurance - Indemnity

## 2023-09-24 ENCOUNTER — Encounter: Admit: 2023-09-24 | Discharge: 2023-09-24 | Payer: Private Health Insurance - Indemnity

## 2023-11-03 ENCOUNTER — Encounter: Admit: 2023-11-03 | Discharge: 2023-11-03 | Payer: Private Health Insurance - Indemnity

## 2023-11-12 ENCOUNTER — Encounter: Admit: 2023-11-12 | Discharge: 2023-11-12

## 2023-12-02 ENCOUNTER — Encounter: Admit: 2023-12-02 | Discharge: 2023-12-02 | Payer: PRIVATE HEALTH INSURANCE

## 2023-12-03 ENCOUNTER — Encounter: Admit: 2023-12-03 | Discharge: 2023-12-03 | Payer: PRIVATE HEALTH INSURANCE

## 2023-12-13 ENCOUNTER — Encounter: Admit: 2023-12-13 | Discharge: 2023-12-13 | Payer: PRIVATE HEALTH INSURANCE

## 2024-01-19 ENCOUNTER — Encounter: Admit: 2024-01-19 | Discharge: 2024-01-19 | Payer: PRIVATE HEALTH INSURANCE

## 2024-01-21 ENCOUNTER — Encounter: Admit: 2024-01-21 | Discharge: 2024-01-21 | Payer: PRIVATE HEALTH INSURANCE

## 2024-01-21 DIAGNOSIS — K51211 Ulcerative (chronic) proctitis with rectal bleeding: Secondary | ICD-10-CM

## 2024-01-21 MED ORDER — MESALAMINE 1,000 MG RE SUPP
1000 mg | Freq: Every evening | RECTAL | 1 refills | 30.00000 days | Status: AC
Start: 2024-01-21 — End: ?

## 2024-01-21 NOTE — Telephone Encounter
 Routing to Dr. Delvin File for review and advisement. Pt requesting 90 day supply through [employer].

## 2024-02-08 ENCOUNTER — Encounter: Admit: 2024-02-08 | Discharge: 2024-02-08 | Payer: PRIVATE HEALTH INSURANCE

## 2024-02-28 ENCOUNTER — Encounter: Admit: 2024-02-28 | Discharge: 2024-02-28 | Payer: PRIVATE HEALTH INSURANCE

## 2024-02-28 MED ORDER — TIZANIDINE 2 MG PO TAB
2-4 mg | ORAL_TABLET | Freq: Every evening | ORAL | 0 refills | 30.00000 days | Status: AC | PRN
Start: 2024-02-28 — End: ?

## 2024-02-28 NOTE — Telephone Encounter
 Refill request for tizanidine    Last OV 04/13/24  Next OV n/a    Refill sent. X1 month. Pt will need new appt for more refills. [facility] message sent.

## 2024-03-20 ENCOUNTER — Ambulatory Visit: Admit: 2024-03-20 | Discharge: 2024-03-20 | Payer: PRIVATE HEALTH INSURANCE

## 2024-03-20 ENCOUNTER — Encounter: Admit: 2024-03-20 | Discharge: 2024-03-20 | Payer: PRIVATE HEALTH INSURANCE

## 2024-03-21 ENCOUNTER — Encounter: Admit: 2024-03-21 | Discharge: 2024-03-21 | Payer: PRIVATE HEALTH INSURANCE

## 2024-04-03 ENCOUNTER — Encounter: Admit: 2024-04-03 | Discharge: 2024-04-03 | Payer: PRIVATE HEALTH INSURANCE

## 2024-04-19 ENCOUNTER — Encounter: Admit: 2024-04-19 | Discharge: 2024-04-19 | Payer: PRIVATE HEALTH INSURANCE

## 2024-04-21 ENCOUNTER — Encounter: Admit: 2024-04-21 | Discharge: 2024-04-21 | Payer: PRIVATE HEALTH INSURANCE

## 2024-04-21 ENCOUNTER — Ambulatory Visit: Admit: 2024-04-21 | Discharge: 2024-04-22 | Payer: PRIVATE HEALTH INSURANCE

## 2024-04-21 DIAGNOSIS — F900 Attention-deficit hyperactivity disorder, predominantly inattentive type: Principal | ICD-10-CM

## 2024-04-21 DIAGNOSIS — E611 Iron deficiency: Secondary | ICD-10-CM

## 2024-04-21 DIAGNOSIS — E559 Vitamin D deficiency, unspecified: Secondary | ICD-10-CM

## 2024-04-21 DIAGNOSIS — Z3169 Encounter for other general counseling and advice on procreation: Secondary | ICD-10-CM

## 2024-04-21 DIAGNOSIS — E538 Deficiency of other specified B group vitamins: Secondary | ICD-10-CM

## 2024-04-21 DIAGNOSIS — F3342 Major depressive disorder, recurrent, in full remission: Secondary | ICD-10-CM

## 2024-04-21 MED ORDER — DEXTROAMPHETAMINE-AMPHETAMINE 10 MG PO CP24
10 mg | ORAL_CAPSULE | Freq: Every day | ORAL | 0 refills | 30.00000 days | Status: AC
Start: 2024-04-21 — End: ?

## 2024-04-21 NOTE — Progress Notes
 ATTENDING NOTE  I discussed Karen Barr with [name], MD and concur with the assessment and treatment plan. Patient is 38 y.o. female with MDD in remission, PTSD in remission, ADHD, and unspecified anxiety. Psychiatric symptoms well controlled at today's encounter. Denies SI/HI and AVH and no other safety concerns. Pt reports no medication side effects.    Vitals:    04/21/24 1350   BP: (!) 126/91   Pulse: 86      PLAN:  The following medication changes were made during this visit to better treat the above symptoms:  Increase Adderall XR to 10 mg daily for ADHD.   Patient is planning to conceive by the end of the year. Patient prefers to discontinue Adderall prior to conceiving.  Studies have indicated that stimulants may be associated with several pregnancy complications. These include a higher higher risk of premature delivery and low birth weight, as the medication can cause blood vessels to narrow, reducing blood flow to the fetus and potentially inducing uterine contractions. There is also a possible increased risk of preeclampsia (high blood pressure during pregnancy). Exposure during pregnancy may also lead to neonatal withdrawal or distress syndromes after birth. Despite these risks, untreated ADHD can have significant adverse effects on both the mother and the baby. Women with ADHD who do not receive treatment during pregnancy are more likely to experience depressive episodes, postpartum depression, hyperemesis gravidarum, eclampsia, and other adverse health outcomes. Continuing ADHD medication during pregnancy may be necessary to maintain the mother's daily functioning and overall health. Recent studies suggest that women who continue their ADHD medication throughout pregnancy do not experience elevated risks of adverse neonatal or maternal health outcomes compared to those who stop their medication.   No labs needed    Current Outpatient Medications   Medication Instructions amphetamine -dextroamphetamine  XR (ADDERALL XR) 5 mg capsule 5 mg, Oral, DAILY    letrozole  (FEMARA ) 2.5 mg, Oral, DAILY, Take one tablet by mouth daily for 5 days, start on day 3 of your menstrual cycle. (To be taken on cycle days 3-7)    mesalamine  (CANASA ) 1,000 mg, Rectal, AT BEDTIME DAILY    tiZANidine  (ZANAFLEX ) 2-4 mg, Oral, AT BEDTIME PRN      Lum LITTIE Rummer, DO  04/21/2024

## 2024-04-21 NOTE — Progress Notes
 Outpatient Psychiatry Progress Note    Subjective:      Karen Barr is a 38 y.o. female with a past medical history of Vit D deficiency, ulcerative colitis and past psychiatric history of ADHD inattentive, unspecified anxiety, MDD in remission, PTSD in remission who presents for follow up appointment today.     Patient was last seen on 03/20/24 by me to establish care. At that time, we started Adderall 5 mg qAM for ADHD.     Currently taking:  Adderall XR 5 mg qAM    Feels like things are about the same.   Started taking lots of supplements.     Has colitis and sometimes in the morning when she has coffee or energy drink, has diarrhea. Feels like the iron  supplements are helping to combat this.     Adderall XR - doing fine with it, gets a hot flash for 10 min after taking it. Feels something, not sure if that is associated with all of the extra supplements they've been taking or what.     At work, not really much of a difference. The first week, felt very focused and then it started being less noticeable.     Starts wearing off around 3 pm.     Mood: fine  Sleep: good, no less than 5.5 - 7 hrs/night  Energy: better  Appetite: some suppression of appetite    Before adderall, yogurt would get her through to 10 am, and now they will get a breakfast sandwich in the morning and that will get her through to 1 pm.     Exercise: Good, going to the gym, right now about three times/week.     Son and wife, just really busy. Wife is gonna be out of town a lot so they are planning for that.     Still planning to get pregnant around end of October.    SI: denies  HI: denies  AVH: denies    BP is a tad high at 126/91, had preworkout around 10 am this morning then went to the gym.     Previous medication trials:  - Sertraline  - 25 mg daily - two years. No SE. Did find it helpful.   - Paxil - emotional detachment (at 25, for > 6 months, no mania at that time).         Family psychiatric history:  - Bipolar in sister possibly, not diagnosed.   - Mom with depression, anxiety.   - No suicide attempts or completed suicide. No schizophrenia.         Substance Use History:  - Tobacco: Denies  - Alcohol: Maybe binge drinking once a year, but otherwise drinking 1-2 drinks at a time.   - THC: Just when she goes to bed. Edible 5 mg qhs. Smokes rarely.   - Cocaine: Denies  - Methamphetamines: Denies  - PCP: Denies  - Hallucinogens: Denies  - Prescription pills: Denies        Social History:  - Siblings: half sister, two half brothers, two half sisters, adoptive sister.   - Education: three courses away from bachelor's in psych  - Married: x1 - wife. 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   - Housing: Lives in a home with wife and son.   - Pets: two dogs: Hollie armin Meuse. Two cats: Cheech and Babs  - Support system: wife  -  Military hx: been in [place]    PDMP reviewed: yes    Review of Systems    Objective:          amphetamine -dextroamphetamine  XR (ADDERALL XR) 5 mg capsule Take one capsule by mouth daily. Indications: attention deficit disorder with hyperactivity    letrozole  (FEMARA ) 2.5 mg tablet Take one tablet by mouth daily. Take one tablet by mouth daily for 5 days, start on day 3 of your menstrual cycle. (To be taken on cycle days 3-7)    mesalamine  (CANASA ) 1,000 mg rectal suppository Insert or Apply one suppository to rectal area as directed at bedtime daily.    tiZANidine  (ZANAFLEX ) 2 mg tablet Take one tablet to two tablets by mouth at bedtime as needed.     Vitals:    04/21/24 1350   BP: (!) 126/91   BP Source: Arm, Left Upper   Pulse: 86   PainSc: Zero   Weight: 111.1 kg (245 lb)   Height: 175.3 cm (5' 9)     Body mass index is 36.18 kg/m?SABRA     Physical Exam  Mental Status Exam  General/Constitutional: appears stated age, dressed in personal attire, and fair grooming  Eye Contact: good  Behavior: calm, cooperative, and appropriate for conversation  Attention: appropriate for conversation  Speech: RRR, normal volume, normal tone, and fair articulation  Mood: good  Affect: euthymic, mood congruent  Thought process:linear and goal directed  Thought content: denies Suicidal Ideation, Homicidal Ideation. No evidence of delusions  Perception: denies Auditory Visual Hallucinations  Associations: Intact  Insight: fair  Judgment: fair  Orientation: AAOx3 (name/year/location)  Recent and remote memory: grossly intact  Attention span and concentration: appropriate for conversation  Cognition: average  Language: english, fluent  Fund of knowledge and vocabulary: average     Physical Exam:  Neuro: No tics, tremors, akathisia noted.  Musculoskeletal: Moves all four extremities spontaneously  Gait: normal, no ataxia      Metabolic monitoring:  Metabolic monitoring:     Body mass index is 36.18 kg/m?SABRA  Wt Readings from Last 3 Encounters:   04/21/24 111.1 kg (245 lb)   03/20/24 113.4 kg (250 lb)   05/05/23 111.6 kg (246 lb)     BP Readings from Last 3 Encounters:   04/21/24 (!) 126/91   03/20/24 132/79   05/05/23 112/68     Lab Results   Component Value Date    CHOL 166 07/30/2017    TRIG 87 07/30/2017    HDL 51 07/30/2017    LDL 98 07/30/2017    HGBA1C 5.5 03/20/2024    VITB12 341 03/20/2024    VITD25 18 (L) 03/20/2024         AIMS  No data recorded      Assessment and Plan:  Karen Barr is a 38 y.o. female with a past medical history of Vit D deficiency and past psychiatric history of ADHD inattentive, unspecified anxiety, MDD in remission, PTSD in remission who presents for follow up appointment today.  Today, patient reports that she is overall doing well with no significant changes noted in her work or home. She notes mild appetite suppression with this. Denies SI, HI, AVH. She is taking the supplements regularly. Treatment plan includes increasing adderall for efficacy.     1. ADHD (attention deficit hyperactivity disorder), inattentive type 2. Vitamin D deficiency    3. Recurrent major depressive disorder, in full remission    4. Vitamin B12 deficiency    5. Iron   deficiency        Plan:  - Increase Adderall XR to 10 mg daily for ADHD. Risks and benefits discussed. Patient to keep an eye on blood pressure.   - Continue supplementation with Vitamin D, Vitamin B12, iron .   ADHD meds in Pregnancy:   For the adderall, stimulants, in general main side effects include vasoconstriction, growth restriction of fetus. Lowest dose is what is most important. Methylphenidate in general has more data in pregnancy compared to adderall and other stimulants so we could consider that.  Strattera in mice at high doses has been shown to have birth defects so we don't reach for this typically.   Guanfacine mainly causes reduced blood pressure so we would need to be cautious of that.  Wellbutrin is probably the safest option for pregnancy.     Discussion  The proposed treatment plan was discussed with the patient who was provided the opportunity to actively take part finalizing the current treatment plan.   Discussed risks, benefits and potential side effects of medications with patient and guardian as well as alternative treatments  Discussed relevant black box warnings   patient reported understanding of the risks, benefits and possible side effects and provided consent for treatment unless otherwise stated  Discussed contingency/emergency plans with the patient should there be concern for attempting suicide, committing self-harm or other potential injuries to self or others  These plans include:  Contacting the mental health clinic (calling, walk-in, etc.)  Calling 911 or calling the crisis line  Visiting the ER at any time    Return to clinic in 1 month    Patient discussed with Dr. Henry Gaynell Abernethy, M.D.  Internal Medicine/Psychiatry PGY-5  Pager [phone] - Available on Vo[employer]    Safety plan discussed at length and outlined in detail on after visit summary    The proposed treatment plan was discussed with the patient/guardian who was provided the opportunity to ask questions and make suggestions regarding alternative treatment.

## 2024-05-07 ENCOUNTER — Encounter: Admit: 2024-05-07 | Discharge: 2024-05-07 | Payer: PRIVATE HEALTH INSURANCE

## 2024-05-24 ENCOUNTER — Encounter: Admit: 2024-05-24 | Discharge: 2024-05-24 | Payer: PRIVATE HEALTH INSURANCE

## 2024-05-25 ENCOUNTER — Encounter: Admit: 2024-05-25 | Discharge: 2024-05-25 | Payer: PRIVATE HEALTH INSURANCE

## 2024-06-01 ENCOUNTER — Encounter: Admit: 2024-06-01 | Discharge: 2024-06-01 | Payer: PRIVATE HEALTH INSURANCE

## 2024-06-07 NOTE — Progress Notes
 Note to patient: The 21st Century Cures Act makes medical notes like these available to patients in the interest of transparency. However, be advised this is a medical document. It is intended as peer to peer communication. It is written in medical language and may contain abbreviations or words that are unfamiliar. It may appear blunt or direct. Medical documents are intended to carry relevant information, facts as evident, and the clinical opinion of the clinician.    Date of Service: 06/08/2024    Karen Barr is a 38 y.o. female.  DOB: 02-Feb-1986  MRN: [id number]     Subjective:             History of Present Illness     Karen Barr is a 38 year old female with h/o colitis who presents for ED visit follow up 05/24/2024 for severe epigastric abdominal pain.    She experienced a severe episode of abdominal pain on October 8th, escalating rapidly in intensity within minutes, making it difficult for her to breathe. This occurred after a few bites of food at dinner, following a day of diarrhea and fatigue. The pain was described as similar to a gallbladder attack, although her gallbladder has been removed. She reported that a CT scan at the ER showed no gallbladder, and the ER did not provide a definitive diagnosis. She has not experienced similar pain episodes since then.    She has a history of colitis, typically presenting with bloody diarrhea but no pain, managed with mesalamine  medication prn. Her last colonoscopy was in 2021, and she has an upcoming appointment with a gastroenterologist. No recent episodes of bloody diarrhea have occurred.    During the ER visit, a fatty liver was noted, which had not been previously identified. She has since made dietary changes, resulting in a 15-pound weight loss. She does not consume much alcohol and has been focusing on a healthier diet.    She recently re-tore her meniscus while playing with her child and is scheduled to see an orthopedic specialist at [facility] who previously did her ACL repair.                Objective:          amphetamine -dextroamphetamine  XR (ADDERALL XR) 10 mg capsule Take one capsule by mouth daily. Indications: attention deficit disorder with hyperactivity    CHOLEcalciferoL (vitamin D3) 1,000 units tablet Take two tablets by mouth daily.    cyanocobalamin (vitamin B-12) (VITAMIN B-12) 1,000 mcg tablet Take one tablet by mouth daily.    ferrous sulfate  (FEOSOL) 325 mg (65 mg iron ) tablet Take one tablet by mouth daily. Take on an empty stomach at least 1 hour before or 2 hours after food.    letrozole  (FEMARA ) 2.5 mg tablet Take one tablet by mouth daily. Take one tablet by mouth daily for 5 days, start on day 3 of your menstrual cycle. (To be taken on cycle days 3-7) (Patient not taking: Reported on 06/08/2024)    magnesium citrate 200 mg tab Take one tablet by mouth daily.    mesalamine  (CANASA ) 1,000 mg rectal suppository Insert or Apply one suppository to rectal area as directed at bedtime daily. (Patient not taking: Reported on 06/08/2024)    pyridoxine (vitamin B6) 50 mg cap Take one capsule by mouth daily.    tiZANidine  (ZANAFLEX ) 2 mg tablet Take one tablet to two tablets by mouth at bedtime as needed.     Vitals:    06/08/24 1034  BP: 122/82   Pulse: 79   Temp: 36.3 ?C (97.3 ?F)   Resp: 16   SpO2: 100%   TempSrc: Temporal   PainSc: Zero   Weight: 108.7 kg (239 lb 9.6 oz)   Height: 175.3 cm (5' 9.02)     Body mass index is 35.37 kg/m?Karen Barr     Physical Exam  General: No acute distress, well-appearing.  Eyes: sclera anicteric; conjunctiva clear and well perfused  HEENT: Atraumatic, normocephalic, MMM  Neck: no JVD or nodes; soft and supple; TML  Lungs: CTA bilat. Normal respiratory effort.  Symmetric expansion upon inspiration.  CV: S1, S2 normal with regular rate, without rub, gallop or murmur.  Skin: Warm, moist, normal turgor; no rash appreciated on limited inspection.  Neuro: Alert and grossly oriented with stable level of consciousness. Good tone.  No gross focal deficits appreciated.  Psych: Dressed in street clothes.  Calm and cooperative. Affect normal. Linear logical and goal directed.  Abdomen soft, neg Murphys, mild epigastric pain with deep palpation, +NABS, no CVA tenderness       Assessment and Plan:  Karen Barr was seen today for emergency room follow up.    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 CT scan ruled out gallstones. Differential diagnosis included appendicitis and kidney stones, but these were inconsistent with presentation. Pain improved, but patient reported occasional mild sharp pain in the lower abdomen in the days following. Possible viral gastroenteritis considered.  - Keep appointment with gastroenterology to evaluate potential colitis involvement    Ulcerative proctitis with rectal bleeding  Ulcerative proctitis with rectal bleeding, typically occurring annually and managed with medication. No recent episodes of rectal bleeding or pain.  - Keep appointment with gastroenterology for follow-up    Fatty liver disease  Fatty liver disease identified on imaging with normal liver function tests, indicating no acute hepatitis. Discussed association with metabolic syndrome and potential progression to cirrhosis. Advised on lifestyle modifications, including weight loss, healthy diet, and exercise  - Order FibroScan to assess liver health  - Order CMP and GGT to monitor liver function  - Advise on lifestyle modifications including weight loss, healthy diet, and exercise       Total Time Today was 30 minutes in the following activities: Preparing to see the patient, Obtaining and/or reviewing separately obtained history, Performing a medically appropriate examination and/or evaluation, Counseling and educating the patient/family/caregiver and Documenting clinical information in the electronic or other health record    Patient Instructions   Schedule ultrasound fibroscan  Go to lab today  Keep appointment for physical/pap  Keep appointment with GI      Assessment & Plan  Epigastric pain         Fatty liver    Orders:    US  ELASTOGRAPHY LIVER FIBROSIS SCORE; Future    COMPREHENSIVE METABOLIC PANEL; Future    GGTP; Future    Ulcerative (chronic) proctitis with  rectal bleeding (CMS-HCC)   - lab work reviewed, vitals and symptoms reviewed   - current condition and recent clinical history evaluated  - chronic condition, stable   - will continue current management and surveillance           Need for vaccination    Orders:    COVID-19 VACCINE (>=12YO) (MODERNA) MRNA PF      Problem   Ulcerative (Chronic) Proctitis With Rectal Bleeding (Cms-Hcc)       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

## 2024-06-08 ENCOUNTER — Encounter: Admit: 2024-06-08 | Discharge: 2024-06-08 | Payer: PRIVATE HEALTH INSURANCE

## 2024-06-08 ENCOUNTER — Ambulatory Visit: Admit: 2024-06-08 | Discharge: 2024-06-08 | Payer: PRIVATE HEALTH INSURANCE

## 2024-06-08 VITALS — BP 122/82 | HR 79 | Temp 97.30000°F | Resp 16 | Ht 69.016 in | Wt 239.6 lb

## 2024-06-08 DIAGNOSIS — Z23 Encounter for immunization: Secondary | ICD-10-CM

## 2024-06-08 DIAGNOSIS — R1013 Epigastric pain: Principal | ICD-10-CM

## 2024-06-08 DIAGNOSIS — K51211 Ulcerative (chronic) proctitis with rectal bleeding: Secondary | ICD-10-CM

## 2024-06-08 DIAGNOSIS — K760 Fatty (change of) liver, not elsewhere classified: Secondary | ICD-10-CM

## 2024-06-08 MED ORDER — DEXTROAMPHETAMINE-AMPHETAMINE 10 MG PO CP24
10 mg | ORAL_CAPSULE | Freq: Every day | ORAL | 0 refills | 30.00000 days | Status: AC
Start: 2024-06-08 — End: ?

## 2024-06-08 NOTE — Progress Notes
 Patient presents to clinic for Covid injection. Injection given without difficulty, VIS sheet given. Consent form signed.

## 2024-06-14 NOTE — Progress Notes [1]
 Outpatient Psychiatry Progress Note    Subjective:      Karen Barr is a 38 y.o. female with a past medical history of Vit D deficiency, ulcerative colitis and past psychiatric history of ADHD inattentive, unspecified anxiety, MDD in remission, PTSD in remission who presents for follow up appointment today.     Patient was last seen on 04/21/24 by me. At that time, we increased Adderall XR to 10 mg daily     Currently taking:  Adderall XR 10 mg daily    Things are going well, feels more aware especially the last two weeks. Feels like before he could only see directly what she was focused on but awareness of other things is better now.    2.5 weeks ago, feels like she can see a lot more. Made herself a list, looking at the list,and doesn't know where to begin.   Work is better, can remember things better. Still starting a project, like reorganizing the pantry, and then still skipping to the next thing.     Side effects: Denies    Mood: better, thinks that there has been a bit of turn in their relationship with wife for the better, don't argue as much. Sees that patient is present more often in helping with the house. When patient was like 5, her mom worked nights, and after watching a video about how early childhood parentification can change people and how they perceive the world and such, and sharing that with her wife, that has helped a lot. With that relaization, things with wife are a lot better. Extending more grace.     Had a day she had to wake up super early took her meds, and then accidentally took her medicine again. During that day, she was more organized, less scatter brained. She doesn't remember the last time that they spent 45 minutes talking on the phone about nothing.    Anxiety: better, especially when talking to people at work. Could have surface level conversations but lost interest after surface level stuff. Feels like this has gotten better, remembering more details about people. And she wants to get to know these people. Able to joke with people, feels more relaxed.     Sleep: good, going to get a circadian rhythm light to help her wake up.     Energy: good, lot better.     Couple weeks ago, wife has gastroenteritis, and she started feeling the body aches, energy tanked, and she got really bad 10/10 abdominal pain, felt like GB pain even though she doesn't have one. Liver is enlarged and she has a small hernia. That scared her so they are holding off of the pregnancy for now.    Is trying to take control of what she eats. It is hard to find healthy food at work unless you eat a salad. Trying to add fruits back in. Dropped like 16 lbs from 10/6-10/30.   She is gonna have knee surgery again, hopefully gets a call in the next couple days to schedule it, but late next month. Was finally able to start running without pain a few months ago, and then hyperextended knee briefly and ended up tearing her meniscus again.     Then compounding that and Doctor recommended surgery, and he doesn't know the best plan for it.   Has been able to do some moderate level exercise but in trying not to put too much pressure on it, she pulled her quad.  Pregnancy is on pause until they figure out what's going on with her liver.   Takes medicine around 5:45 in the morning, and around 5-5:30, starts crashing.     SI: denies  HI: denies  AVH: denies    Previous medication trials:  - Sertraline  - 25 mg daily - two years. No SE. Did find it helpful.   - Paxil - emotional detachment (at 25, for > 6 months, no mania at that time).         Family psychiatric history:  - Bipolar in sister possibly, not diagnosed.   - Mom with depression, anxiety.   - No suicide attempts or completed suicide. No schizophrenia.         Substance Use History:  - Tobacco: Denies  - Alcohol: Maybe binge drinking once a year, but otherwise drinking 1-2 drinks at a time.   - THC: Just when she goes to bed. Edible 5 mg qhs. Smokes rarely.   - Cocaine: Denies  - Methamphetamines: Denies  - PCP: Denies  - Hallucinogens: Denies  - Prescription pills: Denies        Social History:  - Siblings: half sister, two half brothers, two half sisters, adoptive sister.   - Education: three courses away from bachelor's in psych  - Married: x1 - wife. 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   - Housing: Lives in a home with wife and son.   - Pets: two dogs: Hollie armin Meuse. Two cats: Cheech and Babs  - Support system: wife  Therapist, Nutritional hx: been in [place]    PDMP reviewed: yes    Review of Systems    Objective:          amphetamine -dextroamphetamine  XR (ADDERALL XR) 10 mg capsule Take one capsule by mouth daily. Indications: attention deficit disorder with hyperactivity    CHOLEcalciferoL (vitamin D3) 1,000 units tablet Take two tablets by mouth daily.    cyanocobalamin (vitamin B-12) (VITAMIN B-12) 1,000 mcg tablet Take one tablet by mouth daily.    ferrous sulfate  (FEOSOL) 325 mg (65 mg iron ) tablet Take one tablet by mouth daily. Take on an empty stomach at least 1 hour before or 2 hours after food.    letrozole  (FEMARA ) 2.5 mg tablet Take one tablet by mouth daily. Take one tablet by mouth daily for 5 days, start on day 3 of your menstrual cycle. (To be taken on cycle days 3-7) (Patient not taking: Reported on 06/08/2024)    magnesium citrate 200 mg tab Take one tablet by mouth daily.    mesalamine  (CANASA ) 1,000 mg rectal suppository Insert or Apply one suppository to rectal area as directed at bedtime daily. (Patient not taking: Reported on 06/08/2024)    pyridoxine (vitamin B6) 50 mg cap Take one capsule by mouth daily.    tiZANidine  (ZANAFLEX ) 2 mg tablet Take one tablet to two tablets by mouth at bedtime as needed.     Vitals:    06/15/24 0918   BP: 132/70   BP Source: Arm, Left Upper   Pulse: 69   PainSc: Four   Weight: 106.1 kg (234 lb)   Height: 175.3 cm (5' 9)     Body mass index is 34.56 kg/m?SABRA     Physical Exam  Mental Status Exam  General/Constitutional: appears stated age, dressed in personal attire, and fair grooming  Eye Contact: good  Behavior: calm,  cooperative, and appropriate for conversation  Attention: appropriate for conversation  Speech: RRR, normal volume, normal tone, and good articulation  Mood: good  Affect: euthymic, mood congruent  Thought process:linear and goal directed  Thought content: denies Suicidal Ideation, Homicidal Ideation. No evidence of delusions  Perception: denies Auditory Visual Hallucinations  Associations: Intact  Insight: fair  Judgment: fair  Orientation: AAOx3 (name/year/location)  Recent and remote memory: grossly intact  Attention span and concentration: appropriate for conversation  Cognition: average  Language: english, fluent  Fund of knowledge and vocabulary: average     Physical Exam:  Neuro: No tics, tremors, akathisia noted.  Musculoskeletal: Moves all four extremities spontaneously  Gait: normal, no ataxia      Metabolic monitoring:  Metabolic monitoring:     Body mass index is 34.56 kg/m?SABRA  Wt Readings from Last 3 Encounters:   06/15/24 106.1 kg (234 lb)   06/08/24 108.7 kg (239 lb 9.6 oz)   04/21/24 111.1 kg (245 lb)     BP Readings from Last 3 Encounters:   06/15/24 132/70   06/08/24 122/82   04/21/24 (!) 126/91     Lab Results   Component Value Date    CHOL 166 07/30/2017    TRIG 87 07/30/2017    HDL 51 07/30/2017    LDL 98 07/30/2017    HGBA1C 5.5 03/20/2024    VITB12 341 03/20/2024    VITD25 18 (L) 03/20/2024         AIMS  No data recorded    Assessment and Plan:  Iowa Kappes is a 38 y.o. female with a past medical history of Vit D deficiency, ulcerative colitis and past psychiatric history of ADHD inattentive, unspecified anxiety, MDD in remission, PTSD in remission who presents for follow up appointment today.  Today, patient reports that she is doing better with the 10 mg dose with improvement in her relationship with wife, improved anxiety at work, being more organized. She is functioning better. She does still have difficulty initiating tasks and skipping between tasks prior to completion but it is overall less stressful than it was previously. Denies SI, HI, AVH and I have no acute safety concerns.    1. ADHD (attention deficit hyperactivity disorder), inattentive type    2. Recurrent major depressive disorder, in full remission    3. Vitamin D deficiency    4. Vitamin B12 deficiency    5. Iron  deficiency        Plan:  - Increase Adderall XR to 15 mg daily.   - PDMP reviewed with appropriate fills.    Discussion  The proposed treatment plan was discussed with the patient who was provided the opportunity to actively take part finalizing the current treatment plan.   Discussed risks, benefits and potential side effects of medications with patient and guardian as well as alternative treatments  Discussed relevant black box warnings   patient reported understanding of the risks, benefits and possible side effects and provided consent for treatment unless otherwise stated  Discussed contingency/emergency plans with the patient should there be concern for attempting suicide, committing self-harm or other potential injuries to self or others  These plans include:  Contacting the mental health clinic (calling, walk-in, etc.)  Calling 911 or calling the crisis line  Visiting the ER at any time    Return to clinic in 7-8 weeks.    Patient discussed with Dr. Ronnette Gaynell Abernethy, M.D.  Internal Medicine/Psychiatry PGY-5  Pager [phone] - Available  on Voalte    Safety plan discussed at length and outlined in detail on after visit summary    The proposed treatment plan was discussed with the patient/guardian who was provided the opportunity to ask questions and make suggestions regarding alternative treatment.

## 2024-06-15 ENCOUNTER — Encounter: Admit: 2024-06-15 | Discharge: 2024-06-15 | Payer: PRIVATE HEALTH INSURANCE

## 2024-06-15 ENCOUNTER — Ambulatory Visit: Admit: 2024-06-15 | Discharge: 2024-06-16 | Payer: PRIVATE HEALTH INSURANCE

## 2024-06-15 VITALS — BP 132/70 | HR 69 | Ht 69.0 in | Wt 234.0 lb

## 2024-06-15 DIAGNOSIS — F3342 Major depressive disorder, recurrent, in full remission: Secondary | ICD-10-CM

## 2024-06-15 DIAGNOSIS — E559 Vitamin D deficiency, unspecified: Secondary | ICD-10-CM

## 2024-06-15 DIAGNOSIS — F900 Attention-deficit hyperactivity disorder, predominantly inattentive type: Principal | ICD-10-CM

## 2024-06-15 DIAGNOSIS — E538 Deficiency of other specified B group vitamins: Secondary | ICD-10-CM

## 2024-06-15 DIAGNOSIS — E611 Iron deficiency: Secondary | ICD-10-CM

## 2024-06-15 MED ORDER — DEXTROAMPHETAMINE-AMPHETAMINE 15 MG PO CP24
15 mg | ORAL_CAPSULE | Freq: Every day | ORAL | 0 refills | 30.00000 days | Status: AC
Start: 2024-06-15 — End: ?

## 2024-06-15 NOTE — Progress Notes [1]
 ATTENDING NOTE  I discussed Karen Barr with [name], MD and concur with the assessment and treatment plan. Patient is 38 y.o. female with MDD, PTSD, ADHD, and Other Specified Anxiety Disorder. Pt reports improvement in symptoms of ADHD as Adderall is being titrated.  Denies SI/HI and AVH and no other safety concerns. Pt reports no medication side effects.    PLAN:  The following medication changes were made during this visit to better treat the above symptoms:  Increase Adderall XR to 15mg  QAM  No labs needed    Rocky LOISE Pipe, MD  06/15/2024

## 2024-06-15 NOTE — Patient Instructions [37]
 It was nice to see you in clinic today!   If questions arise after this appointment, you can reach the clinic by calling [phone].    Actions from today's visit:  We will increase the Adderall XR to 15 mg daily.     We will see you back in about 1-2 months for follow up.    - Dr. Luz    Our clinic is dedicated to making sure your prescriptions are filled in a timely manner during regular business hours (8am-5pm).  If you need a medication refill, please call your pharmacy to request a refill.  Please make sure to request refills at least 72 hours in advance of your last dose.  Your pharmacy will reach out to us  electronically, which is the preferred method.  Please try to refrain from paging the on-call psychiatrist regarding medication refills (including controlled substances), as you may not receive a refill or a full refill at that time.    ________________________________________________________________________________________________________________________________________________________________  General Clinic Information  - MyChart Messaging is my preferred method of communication. You should receive a response from myself or my staff within 72 business hours. Please be patient. MyChart is a secure messaging system available through our electronic medical record.  If you would like to sign up, please see the information at the bottom of this sheet.  - Psychiatry Clinic Phone Number is [phone]. This may be used to speak with clinic staff for non-emergent concerns during normal business hours. If you leave a voicemail, you should receive a response within 72 business hours from our clinic staff/providers. Please be patient.  - Psychiatry Clinic Fax Number is [phone]. This may be used to get me records from institutions outside of [facility].  - Clinic hours: 8 am-5 pm M-F     Medication Refils  - Please refrain from paging the on-call psychiatrist for medication refills, as you may not receive a refill or a full refill at that time. Our clinic is dedicated to making sure your prescriptions are filled in a timely manner during regular business hours (8am-5pm).  If you need a medication refill, please call your pharmacy to request a refill.  Please make sure to request refills at least 72 hours in advance of your last dose.  Your pharmacy will reach out to us  electronically, which is the preferred method.       Psychiatric Emergencies  - If you are concerned about an impending psychiatric emergency (i.e. if you have any concerns about risk of harm to yourself, risk of harm to others or feel unsure about your ability to care for yourself), you may reach out to the overnight psychiatrist on-call by calling the main [place] operator line outside of normal business hours [phone]). If you are unsure if you are experiencing a psychiatric emergency, please ask the operator to page the on-call psychiatrist for clarification.   - If you require an immediate response from a health care professional, please do not call the on-call psychiatrist and call 911 directly.   - National Suicide Prevention Lifeline (24/7/365): 988  - The Crisis Text Hotline (text 512-326-0179)     Other Resources:  The Compassionate Ear phone line  Peer-run listening service that provides non-crisis supportive listening, coping strategies, information and a reprieve from loneliness or isolation  Phone: 3146844344  Open 9a - 9p daily, including holidays  National Alliance on Mental Illness: https://www.nami.org   Substance Abuse and Mental Health Services Administration: birthdayfever.at   National human trafficking hotline: 367-649-1781 or  text 952-637-5380  [employer] Violence Hotline: 1- 250 349 5954

## 2024-06-16 ENCOUNTER — Ambulatory Visit: Admit: 2024-06-16 | Discharge: 2024-06-17 | Payer: PRIVATE HEALTH INSURANCE

## 2024-06-16 ENCOUNTER — Encounter: Admit: 2024-06-16 | Discharge: 2024-06-16 | Payer: PRIVATE HEALTH INSURANCE

## 2024-06-16 ENCOUNTER — Ambulatory Visit: Admit: 2024-06-16 | Discharge: 2024-06-16 | Payer: PRIVATE HEALTH INSURANCE

## 2024-06-16 VITALS — BP 105/75 | HR 68 | Temp 97.50000°F | Resp 12 | Ht 69.016 in | Wt 238.6 lb

## 2024-06-16 DIAGNOSIS — E559 Vitamin D deficiency, unspecified: Secondary | ICD-10-CM

## 2024-06-16 DIAGNOSIS — D509 Iron deficiency anemia, unspecified: Secondary | ICD-10-CM

## 2024-06-16 MED ORDER — PEG 3350-ELECTROLYTES 236-22.74-6.74 -5.86 GRAM PO SOLR
0 refills | 1.00000 days | Status: AC
Start: 2024-06-16 — End: ?

## 2024-06-16 NOTE — Patient Instructions [37]
 >>SURVEY<<  Dr. Riccardo and Our IBD Team are interested in hearing your thoughts about your visit today.  We care about you and want to make sure we are providing exceptional, quality care.   If you receive a survey either by text or mail, we would be grateful if you would fill this out. Thank you for helping us  serve you better!      AFTER VISIT SUMMARY    Plan Summary  Stool study now  Plan for Colonoscopy next available  Continue mesalamine  as currently prescribed.   Follow up appointment  in 3-4 months.         PLEASE COMPLETE  STOOL: Due Now: Fecal Cal Protectin  Please go by the lab to obtain a stool collection kit. This order has been placed to be done at any of our [place] Lab Locations (listed below).       MEDICATIONS  CONTINUE: Mesalamine  as currently prescribed        PROCEDURES:   I have placed the order for your upcoming Colonoscopy.  The Endoscopy Procedure Scheduling team will be contacting you to get this scheduled.   The prescription for your colon prep has been sent to your pharmacy.    Please keep an eye out for a separate MyChart message titled Procedure Details for your prep instructions.       FOLLOW UP  Please follow up with one of our IBD Nurse Practitioners, Adam Case or Karen Barr in 3-4 months.    Please call our GI Clinic Appointment scheduling line @ [phone], option 1 to schedule.     -------------------------------------------------------------------------------------------  Inflammatory Bowel Disease (IBD) Clinic Information    Meet Our Dedicated Team   [facility] / Gastroenterology, Multidisciplinary IBD Team    3 IBD Physicians: Dr. Riccardo, Dr. Chipper, and Dr. Srinivastin  2 IBD Advanced Practice Nurse Practitioners: Juliene Case & Karen Barr  5 RN Care Coordinators: One for each of the above provider  2 Pharmacists: Tinnie & Cassandra  1 Medical Assistant: Geraline   1 Social Worker: Alan Batters  & Dieticians as needed    Both of our [facility] Practitioners see each physicians patients and work along with them. There are times you'll see and be treated by them as an extension of the physician team. They all consult and discuss together for the best treatment for all of our patients. For your Biologic Therapy - any changes that are made will come directly from your physician.     As part of your care team, we have a Licensed Specialist Clinical Social Worker, Alan Batters, available to assist in your care. She can help in managing the ups and downs of a chronic illness, providing emotional support resources, helping you to understand your diagnosis, and can also help get you in contact with fianancial/legal resources. Please reach out to her with any questions or concerns you may have.         To Contact Alan, LSCSW - MyChart Messaging / Phone: [phone] / Email: [email or web address]      How To Contact Us   MyChart is the most efficient way to contact your healthcare team.     IBD Pharmacists: Tinnie & Cassandra @ p: [phone] / e: [email or web address]  IBD Medical Assistant: Geraline @ [phone]  GI Clinic Appointment Scheduling @ p: [phone], opt 1   GI Office Fax @ [phone]  Endoscopy Scheduling @ p: [phone]  Radiology Scheduling @ p: [phone]   -------------------------------------------------------------------------------------------------  [facility]   Outpatient Lab Locations  [email or web address]    [facility]  [address]  [place]  1st floor, [address]  [phone]  6:30 AM -  6:00 PM  / Monday  7:00 AM -  6:00 PM  / Tuesday - Friday  7:00 AM - 12:00 PM / Saturday     [facility]  [facility]  [address]  [place], [place] [address]  [phone]  7:00 AM -  5:30 PM / Monday-Friday  7:00 AM - 12:00 PM / Saturday & Sunday    [facility]  [address]  [place], [place] [address]  [phone]   8 a.m.-4:00 p.m. Monday-Friday  Locations are closed all major holidays (excluding Veterans Day).    [facility]  [address]  [place], [place] [address]  Outpatient Lab located on 2nd floor  8 a.m.-5 p.m., M-F    [facility]  [address]on[address]  [place], [place] [address]  [phone]  8 a.m.- 4:30p.m., M-F    [facility]  [address]  [place], [place] [address]  [phone]    [facility] (if established patient)  [address]  [place], [place] [address]  [phone]    [facility]  [phone] [address]  [address], [address]  [place], [place] [address]  Phone: [phone]  OFFICE HOURS  7 a.m. - 5:30 p.m. M-F    [facility]  [address]  [place], [place] [address]  Phone: [phone]  OFFICE HOURS  7 a.m. - 7 p.m, Monday - Sunday    [facility]  [address]  To schedule, visit the [facility] [employer] patient portal or visit Laboratory Services at [email or web address]   Free valet service with wheelchair assistance at [address]  [place], [place] [address]  Phone: [phone]

## 2024-06-16 NOTE — Progress Notes [1]
 Date of Service: 06/16/2024    Subjective:             Karen Barr is a 38 y.o. female.    History of Present Illness  This is a pleasant 38 year old female with past medical history significant for ulcerative proctitis, currently off therapy, referred to our IBD clinic to establish care and for further recommendations of management of underlying UC.    Patient states that she had issues with rectal bleeding and urgency in the past warranting further investigations.  Had colonoscopy on 07/08/2020 that showed ulcerative proctitis, moderate in severity, otherwise normal colon proximal to 10 cm from the anal verge, also normal TI.  Histology showed chronic active colitis with moderate acute and chronic inflammation, acute cryptitis, crypt abscesses and focal architectural distortion.    Patient was treated with Canasa  suppositories and she did well on that, currently off therapy but states that she encounters flareups about once a year or so, improved with mesalamine  suppository.  Currently, patient denies any significant symptoms including abdominal pain, diarrhea, rectal bleeding or extraintestinal manifestations including skin rash, joint pain/swelling or vision changes.  She only reports symptoms of bowel urgency.    Most recent labs performed on 03/20/2024 showed iron  deficiency anemia [hemoglobin 11.7, ferritin 14, saturation 8% and TIBC 443], vitamin D deficiency [18] otherwise normal CBC, B12 and CMP.    Past Medical History:    [redacted] weeks gestation of pregnancy    [redacted] weeks gestation of pregnancy    Anxiety    Blood in stool    Degenerative disc disease, cervical    Degenerative disc disease, lumbar    Degenerative disc disease, thoracic    Depression    Generalized headaches    Spinal stenosis    Unspecified deficiency anemia     Surgical History:   Procedure Laterality Date    *Claudene and nephew fast fix 360* ARTHROSCOPY LEFTKNEE WITH PARTIAL LATERAL  MENISCECTOMY Left 03/19/2020    Performed by Leopold Glendia HERO, MD at [facility] OR    COLONOSCOPY DIAGNOSTIC WITH SPECIMEN COLLECTION BY BRUSHING/ WASHING - FLEXIBLE N/A 07/08/2020    Performed by Buckles, Toribio BROCKS, MD at [facility] OR    CHOLECYSTECTOMY      EAR TUBES  1997, 2001, 2010    HX SHOULDER ARTHROSCOPY      HX TONSILLECTOMY  1997    KNEE SURGERY  03/2020     Social History     Socioeconomic History    Marital status: Married     Spouse name: Damien    Number of children: 1   Occupational History    Occupation: Arboriculturist   Tobacco Use    Smoking status: Never    Smokeless tobacco: Never   Vaping Use    Vaping status: Never Used   Substance and Sexual Activity    Alcohol use: Yes     Alcohol/week: 7.0 standard drinks of alcohol     Types: 4 Drinks containing 0.5 oz of alcohol, 3 Standard drinks or equivalent per week    Drug use: Never    Sexual activity: Yes     Partners: Female     Birth control/protection: None     Family History   Problem Relation Name Age of Onset    Diabetes Mother Rosaline [place] Cholesterol Mother Rosaline     Hypertension Mother Rosaline     Miscarriages Mother Rosaline     Back pain Mother Rosaline  Neck Pain Mother Rosaline     Diabetes Sister Daaiyah Baumert     Cancer Maternal Grandmother [facility]     High Cholesterol Maternal Grandmother Ronal     Arthritis Maternal Grandmother Mary     Back pain Sister Mercedes         Fusion                  Objective:         amphetamine -dextroamphetamine  XR (ADDERALL XR) 15 mg capsule Take one capsule by mouth daily. Indications: attention deficit disorder with hyperactivity    [START ON 07/13/2024] amphetamine -dextroamphetamine  XR (ADDERALL XR) 15 mg capsule Take one capsule by mouth daily. Indications: attention deficit disorder with hyperactivity Do not start before July 13, 2024.    CHOLEcalciferoL (vitamin D3) 1,000 units tablet Take two tablets by mouth daily.    cyanocobalamin (vitamin B-12) (VITAMIN B-12) 1,000 mcg tablet Take one tablet by mouth daily.    ferrous sulfate  (FEOSOL) 325 mg (65 mg iron ) tablet Take one tablet by mouth daily. Take on an empty stomach at least 1 hour before or 2 hours after food.    letrozole  (FEMARA ) 2.5 mg tablet Take one tablet by mouth daily. Take one tablet by mouth daily for 5 days, start on day 3 of your menstrual cycle. (To be taken on cycle days 3-7)    magnesium citrate 200 mg tab Take one tablet by mouth daily.    mesalamine  (CANASA ) 1,000 mg rectal suppository Insert or Apply one suppository to rectal area as directed at bedtime daily.    pyridoxine (vitamin B6) 50 mg cap Take one capsule by mouth daily.    tiZANidine  (ZANAFLEX ) 2 mg tablet Take one tablet to two tablets by mouth at bedtime as needed.     There were no vitals filed for this visit.  There is no height or weight on file to calculate BMI.     Physical Exam  General:  Alert, cooperative, no distress, appears stated age.   Head:  Normocephalic, without obvious abnormality, atraumatic  Lungs: Breathing comfortably  Chest wall:  No tenderness or deformity.  Heart:   Regular rate   Abdomen:  soft, non distended, non tender  Extremities: Extremities normal, atraumatic, no cyanosis or edema  Skin: Skin color, texture, turgor normal.   Neurologic: Grossly normal         Assessment and Plan:  This is a pleasant 38 year old female with past medical history significant for ulcerative proctitis, currently off therapy, referred to our IBD clinic to establish care and for further recommendations of management of underlying UC.    # Ulcerative proctitis:  - Presented with rectal bleeding and urgency in the past warranting further investigations.  - Colonoscopy on 07/08/2020 showed ulcerative proctitis, moderate in severity, otherwise normal colon proximal to 10 cm from the anal verge, also normal TI.  - Histology showed chronic active colitis with moderate acute and chronic inflammation, acute cryptitis, crypt abscesses and focal architectural distortion.  -Treated with Canasa  suppositories and she did well on that, currently off therapy but states that she encounters flareups about once a year or so, improved with mesalamine  suppository.  -Currently denies any major GI symptoms, only reports nonspecific bowel urgency    # Iron  deficiency anemia:  - Most recent labs performed on 03/20/2024 showed iron  deficiency anemia [hemoglobin 11.7, ferritin 14, saturation 8% and TIBC 443]  - On iron  supplements as recommended by PCP     #Vitamin D deficiency:  -  Most recent labs on 8//2025 showed vitamin D deficiency [18], started on vitamin D supplements by PCP    Recommendation/plan:  Patient currently denies any major GI symptoms except for nonspecific bowel urgency.  However she has been off medications.  Today I discussed with Magdelene the nature of ulcerative colitis, and potential risks of undertreated disease including complications, disease progression and risk of colon cancer.  Also explained our treat to target approach and our goal is to achieve symptomatic remission but also objective evidence of inflammation.  At this time we will start with obtaining fecal calprotectin and repeat colonoscopy to assess disease activity and severity.  Also explained to the patient that she will need to be on long-term treatment given the chronic nature of ulcerative colitis with no cure  Continue to monitor labs including CBC, CMP, CRP together with fecal calprotectin every 3-6 months  Follow-up after the colonoscopy to determine treatment  Return to clinic in 3 months with NP    Patient was advised of the plan and voiced agreement and understanding.     This note was in part completed with Dragon, a voice to text dictation system. Some errors may have occured and persist despite my best efforts to edit this document to eliminate dictation/translation related errors.  If you have questions/concerns, please contact me for clarification.         Gwenette Riccardo COME  Gastroenterology attending  Pager: [phone]

## 2024-06-17 DIAGNOSIS — Z79899 Other long term (current) drug therapy: Secondary | ICD-10-CM

## 2024-06-17 DIAGNOSIS — K51211 Ulcerative (chronic) proctitis with rectal bleeding: Principal | ICD-10-CM

## 2024-06-21 ENCOUNTER — Encounter: Admit: 2024-06-21 | Discharge: 2024-06-21 | Payer: PRIVATE HEALTH INSURANCE

## 2024-06-27 ENCOUNTER — Ambulatory Visit: Admit: 2024-06-27 | Discharge: 2024-06-27 | Payer: PRIVATE HEALTH INSURANCE

## 2024-06-28 ENCOUNTER — Encounter: Admit: 2024-06-28 | Discharge: 2024-06-28 | Payer: PRIVATE HEALTH INSURANCE

## 2024-06-28 ENCOUNTER — Ambulatory Visit: Admit: 2024-06-28 | Discharge: 2024-06-28 | Payer: PRIVATE HEALTH INSURANCE

## 2024-06-28 MED ORDER — LIDOCAINE (PF) 100 MG/5 ML (2 %) IV SYRG
INTRAVENOUS | 0 refills | Status: DC
Start: 2024-06-28 — End: 2024-06-28

## 2024-06-28 MED ORDER — PROPOFOL 10 MG/ML IV EMUL 50 ML (INFUSION)(AM)(OR)
INTRAVENOUS | 0 refills | Status: DC
Start: 2024-06-28 — End: 2024-06-28

## 2024-06-29 ENCOUNTER — Encounter: Admit: 2024-06-29 | Discharge: 2024-06-29 | Payer: PRIVATE HEALTH INSURANCE

## 2024-07-10 ENCOUNTER — Ambulatory Visit: Admit: 2024-07-10 | Discharge: 2024-07-10 | Payer: PRIVATE HEALTH INSURANCE

## 2024-07-10 ENCOUNTER — Encounter: Admit: 2024-07-10 | Discharge: 2024-07-10 | Payer: PRIVATE HEALTH INSURANCE

## 2024-07-17 ENCOUNTER — Encounter: Admit: 2024-07-17 | Discharge: 2024-07-17 | Payer: PRIVATE HEALTH INSURANCE

## 2024-08-03 ENCOUNTER — Ambulatory Visit: Admit: 2024-08-03 | Discharge: 2024-08-04 | Payer: PRIVATE HEALTH INSURANCE

## 2024-08-03 ENCOUNTER — Encounter: Admit: 2024-08-03 | Discharge: 2024-08-03 | Payer: PRIVATE HEALTH INSURANCE

## 2024-08-03 VITALS — BP 111/70 | HR 68 | Ht 69.0 in | Wt 223.0 lb

## 2024-08-03 DIAGNOSIS — E611 Iron deficiency: Secondary | ICD-10-CM

## 2024-08-03 DIAGNOSIS — F3342 Major depressive disorder, recurrent, in full remission: Secondary | ICD-10-CM

## 2024-08-03 DIAGNOSIS — E538 Deficiency of other specified B group vitamins: Secondary | ICD-10-CM

## 2024-08-03 DIAGNOSIS — E559 Vitamin D deficiency, unspecified: Secondary | ICD-10-CM

## 2024-08-03 DIAGNOSIS — F900 Attention-deficit hyperactivity disorder, predominantly inattentive type: Principal | ICD-10-CM

## 2024-08-03 MED ORDER — DEXTROAMPHETAMINE-AMPHETAMINE 15 MG PO CP24
15 mg | ORAL_CAPSULE | Freq: Every day | ORAL | 0 refills | 30.00000 days | Status: AC
Start: 2024-08-03 — End: ?

## 2024-08-03 NOTE — Progress Notes [1]
 ATTENDING NOTE  I discussed Karen Barr with [name], MD and concur with the assessment and treatment plan. Patient is 38 y.o. female with MDD, PTSD, ADHD, and Other Specified Anxiety Disorder. Pt reports improvement in symptoms of ADHD with higher doses, a little more emotional today but functional day to day.  Denies SI/HI and AVH and no other safety concerns. Pt reports no medication side effects.    PLAN:  The following medications will be continued for the above symptoms:  Continue Adderall XR to 15mg  QAM  No labs needed    Current Outpatient Medications   Medication Instructions    amphetamine -dextroamphetamine  XR (ADDERALL XR) 15 mg capsule 15 mg, Oral, DAILY    amphetamine -dextroamphetamine  XR (ADDERALL XR) 15 mg capsule 15 mg, Oral, DAILY    CHOLEcalciferoL (vitamin D3) 2,000 Units, DAILY    cyanocobalamin (vitamin B-12) (VITAMIN B-12) 1,000 mcg, DAILY    ferrous sulfate  (FEOSOL) 325 mg, DAILY    letrozole  (FEMARA ) 2.5 mg, Oral, DAILY, Take one tablet by mouth daily for 5 days, start on day 3 of your menstrual cycle. (To be taken on cycle days 3-7)    magnesium citrate 200 mg, DAILY    mesalamine  (CANASA ) 1,000 mg, Rectal, AT BEDTIME DAILY    peg 3350 -electrolytes (GOLYTELY ) 236-22.74-6.74 -5.86 gram oral solution Mix as directed on package then refrigerate. Follow instructions given to you by Dr. York office.    pyridoxine (vitamin B6) 50 mg, DAILY    tiZANidine  (ZANAFLEX ) 2-4 mg, Oral, AT BEDTIME PRN         Karen SHAUNNA Eaton, DO  08/03/2024

## 2024-08-03 NOTE — Progress Notes [1]
 Outpatient Psychiatry Progress Note    Subjective:      Karen Barr is a 38 y.o. female with a past medical history of Vit D deficiency, ulcerative colitis and past psychiatric history of ADHD inattentive, unspecified anxiety, MDD in remission, PTSD in remission who presents for follow up appointment today.     Patient was last seen on 06/15/24 by me. At that time, increased the adderall to 15 mg qday.     Currently taking:  Adderall XR 15 mg qday  Interval events since last visit: Had knee surgery which went better than expected. They took off scar tissue and the rest of it went well. Now she is home and trying to stay productive.     Feels like the higher dose of the Adderall is better. Before she felt like it was more energy, and now it is seeing more things that need to be done. It's a little overwhelming. Had a friend come over and help with some tasks yesterday which was nice.     Overall things are good. Wife is doing well. She is a little stressed out with Clotilda not working and new workload. Son is doing good, but they are in the three-nager phase. Lots of saying no, etc. She is used to what he is doing but it is new in him.     Mood: good, been a little down with yesterday tand the day before, she did a lot of walking around at stores. And her knee started hurting and she was limping. A little down because even carrying her son hurts her knees. Is down 27 lbs now and kiddo is like 30 lbs so can tell it is a big difference.     Has fallen back into the stage of only eating when she is hungry, had done that before and had lost quite a bit of weight then. Got down to 165 previously over a year. Discussed the importance of eating protein and getting enough nutrients. Patient asked about juicing vs. Smoothies. Overall, as smoothies have more fiber, they are the better option.     Appetite is being suppressed but not affecting energy or anything else. Forcing self to eat more red meat a bit due to iron  deficiency.  Mostly eating chicken, may eat a burger. Probably still not as much as she wants to. Was going to the gym a lot before she hurt her knee. Planning to get back there once her knee heals.     Asked her to stop the supplements for 10 days before the surgery so she has been off them for a month but restarted in the past couple days.     Usually gets hungry and eats between 2-3 pm, and then not hungry for dinner. May have a small snack.     Nuts make her really gassy, eg. Sunflower seeds.     Weight loss has slowed down. Sees Ortho next week, scheduled to go back to work January.     Sleep: good, no need to set an alarm. Wife Damien works from home. Trying to wake up at 7. Generally gets about 8h. Mood has been a little down because not able to do as much as before.     Trying to reach out to people more, being more responsive when people message her. She is doing much better than where she has been in the past and one reason is the [place] [facility]. Notes that as a child, she was  often bullied and developed into habits of people-pleasing which she has been trying to break. That has contributed to some overall anxiety. She is trying to work on engaging with other people and building a community around her and it is slowly getting better.     SI: denies  HI: denies  AVH: denies    Previous medication trials:  - Sertraline  - 25 mg daily - two years. No SE. Did find it helpful.   - Paxil - emotional detachment (at 25, for > 6 months, no mania at that time).      Family psychiatric history:  - Bipolar in sister possibly, not diagnosed.   - Mom with depression, anxiety.   - No suicide attempts or completed suicide. No schizophrenia.         Substance Use History:  - Tobacco: Denies  - Alcohol: Maybe binge drinking once a year, but otherwise drinking 1-2 drinks at a time.   - THC: Just when she goes to bed. Edible 5 mg qhs. Smokes rarely.   - No other drugs        Social History:  - Siblings: half sister, two half brothers, two half sisters, adoptive sister.   - Education: three courses away from bachelor's in psych  - Married: x1 - wife. 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   - Housing: Lives in a home with wife and son.   - Pets: two dogs: Hollie armin Meuse. Two cats: Cheech and Babs  - Support system: wife  Therapist, Nutritional hx: been in [place]    PDMP reviewed: yes, no aberrant fills    Review of Systems    Objective:          amphetamine -dextroamphetamine  XR (ADDERALL XR) 15 mg capsule Take one capsule by mouth daily. Indications: attention deficit disorder with hyperactivity    amphetamine -dextroamphetamine  XR (ADDERALL XR) 15 mg capsule Take one capsule by mouth daily. Indications: attention deficit disorder with hyperactivity Do not start before July 13, 2024.    CHOLEcalciferoL (vitamin D3) 1,000 units tablet Take two tablets by mouth daily.    cyanocobalamin (vitamin B-12) (VITAMIN B-12) 1,000 mcg tablet Take one tablet by mouth daily.    ferrous sulfate  (FEOSOL) 325 mg (65 mg iron ) tablet Take one tablet by mouth daily. Take on an empty stomach at least 1 hour before or 2 hours after food.    letrozole  (FEMARA ) 2.5 mg tablet Take one tablet by mouth daily. Take one tablet by mouth daily for 5 days, start on day 3 of your menstrual cycle. (To be taken on cycle days 3-7)    magnesium citrate 200 mg tab Take one tablet by mouth daily.    mesalamine  (CANASA ) 1,000 mg rectal suppository Insert or Apply one suppository to rectal area as directed at bedtime daily. (Patient not taking: Reported on 06/21/2024)    peg 3350 -electrolytes (GOLYTELY ) 236-22.74-6.74 -5.86 gram oral solution Mix as directed on package then refrigerate. Follow instructions given to you by Dr. York office.  Indications: emptying of the bowel    pyridoxine (vitamin B6) 50 mg cap Take one capsule by mouth daily.    tiZANidine  (ZANAFLEX ) 2 mg tablet Take one tablet to two tablets by mouth at bedtime as needed.     Vitals:    08/03/24 1047   BP: 111/70   BP Source: Arm, Left Upper  Pulse: 68   PainSc: Zero   Weight: 101.2 kg (223 lb)   Height: 175.3 cm (5' 9)     Body mass index is 32.93 kg/m?SABRA     Physical Exam  Mental Status Exam  General/Constitutional: appears stated age, dressed in personal attire, and fair grooming  Eye Contact: good  Behavior: calm, cooperative, and appropriate for conversation  Attention: appropriate for conversation  Speech: RRR, normal volume, normal tone, and good articulation  Mood: good  Affect: euthymic, mood congruent  Thought process:linear and goal directed  Thought content: denies Suicidal Ideation, Homicidal Ideation. No evidence of delusions  Perception: denies Auditory Visual Hallucinations  Associations: Intact  Insight: fair  Judgment: fair  Orientation: AAOx3 (name/year/location)  Recent and remote memory: grossly intact  Attention span and concentration: appropriate for conversation  Cognition: average  Language: english, fluent  Fund of knowledge and vocabulary: average     Physical Exam:  Neuro: No tics, tremors, akathisia noted.  Musculoskeletal: Moves all four extremities spontaneously  Gait: normal, no ataxia      Metabolic monitoring:  Metabolic monitoring:     Body mass index is 32.93 kg/m?SABRA  Wt Readings from Last 10 Encounters:   08/03/24 101.2 kg (223 lb)   06/28/24 105.8 kg (233 lb 4 oz)   06/16/24 108.2 kg (238 lb 9.6 oz)   06/15/24 106.1 kg (234 lb)   06/08/24 108.7 kg (239 lb 9.6 oz)   04/21/24 111.1 kg (245 lb)   03/20/24 113.4 kg (250 lb)   05/05/23 111.6 kg (246 lb)   05/03/23 111.1 kg (245 lb)   04/14/23 111.1 kg (245 lb)     BP Readings from Last 3 Encounters:   08/03/24 111/70   06/28/24 102/66   06/16/24 105/75     Lab Results   Component Value Date    CHOL 166 07/30/2017    TRIG 87 07/30/2017    HDL 51 07/30/2017    LDL 98 07/30/2017    HGBA1C 5.5 03/20/2024    VITB12 341 03/20/2024    VITD25 18 (L) 03/20/2024         AIMS  No data recorded      Assessment and Plan:  Dejuana Weist is a 38 y.o. female with a past medical history of Vit D deficiency, ulcerative colitis and past psychiatric history of ADHD inattentive, unspecified anxiety, MDD in remission, PTSD in remission who presents for follow up appointment today. Today, patient reports that overall her mood has been a little lower due to reduced activities while healing from her knee surgery. She continues to feel that the Adderall has been helpful and this dose is lasting throughout the day. Her functioning from ADHD-perspective is stable. She is making active efforts to work on diet, healthy eating, and will slowly increase physical activity after knee heals. She is engaging with her community and it is going well. She notes that she is doing much better than she has in the past with overall improved energy and lifestyle practices.    1. ADHD (attention deficit hyperactivity disorder), inattentive type    2. Recurrent major depressive disorder, in full remission    3. Vitamin D deficiency    4. Vitamin B12 deficiency    5. Iron  deficiency        Plan:  - Continue Adderall XR 15 mg qday for ADHD.  - PDMP reviewed. No other med changes at this time.    Discussion  The proposed treatment plan was discussed  with the patient who was provided the opportunity to actively take part finalizing the current treatment plan.   Discussed risks, benefits and potential side effects of medications with patient and guardian as well as alternative treatments  Discussed relevant black box warnings   patient reported understanding of the risks, benefits and possible side effects and provided consent for treatment unless otherwise stated  Discussed contingency/emergency plans with the patient should there be concern for attempting suicide, committing self-harm or other potential injuries to self or others  These plans include:  Contacting the mental health clinic (calling, walk-in, etc.)  Calling 911 or calling the crisis line  Visiting the ER at any time    Return to clinic in 3 months    Patient discussed with Dr. Diania Gaynell Abernethy, M.D.  Internal Medicine/Psychiatry PGY-5  Pager [phone] - Available on Voalte    Safety plan discussed at length and outlined in detail on after visit summary    The proposed treatment plan was discussed with the patient/guardian who was provided the opportunity to ask questions and make suggestions regarding alternative treatment.

## 2024-08-25 ENCOUNTER — Encounter: Admit: 2024-08-25 | Discharge: 2024-08-25 | Payer: PRIVATE HEALTH INSURANCE

## 2024-08-29 ENCOUNTER — Encounter: Admit: 2024-08-29 | Discharge: 2024-08-29 | Payer: PRIVATE HEALTH INSURANCE

## 2024-09-18 ENCOUNTER — Encounter: Admit: 2024-09-18 | Discharge: 2024-09-18 | Payer: PRIVATE HEALTH INSURANCE

## 2024-09-22 ENCOUNTER — Encounter: Admit: 2024-09-22 | Discharge: 2024-09-22 | Payer: PRIVATE HEALTH INSURANCE
